# Patient Record
Sex: Female | Born: 1975 | Race: White | Smoking: Former smoker
Health system: Southern US, Community
[De-identification: ages and names within clinical notes are randomized; demographics above are authoritative.]

## PROBLEM LIST (undated history)

## (undated) DIAGNOSIS — M5126 Other intervertebral disc displacement, lumbar region: Secondary | ICD-10-CM

## (undated) DIAGNOSIS — E039 Hypothyroidism, unspecified: Secondary | ICD-10-CM

## (undated) DIAGNOSIS — D849 Immunodeficiency, unspecified: Secondary | ICD-10-CM

## (undated) DIAGNOSIS — C801 Malignant (primary) neoplasm, unspecified: Secondary | ICD-10-CM

## (undated) DIAGNOSIS — J45909 Unspecified asthma, uncomplicated: Secondary | ICD-10-CM

## (undated) DIAGNOSIS — N939 Abnormal uterine and vaginal bleeding, unspecified: Secondary | ICD-10-CM

## (undated) DIAGNOSIS — M51369 Other intervertebral disc degeneration, lumbar region without mention of lumbar back pain or lower extremity pain: Secondary | ICD-10-CM

## (undated) DIAGNOSIS — M5136 Other intervertebral disc degeneration, lumbar region: Secondary | ICD-10-CM

## (undated) HISTORY — PX: OTHER SURGICAL HISTORY: SHX169

---
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
## 1999-06-26 ENCOUNTER — Other Ambulatory Visit: Admission: RE | Admit: 1999-06-26 | Discharge: 1999-06-26 | Payer: Self-pay | Admitting: Obstetrics and Gynecology

## 2000-01-23 ENCOUNTER — Inpatient Hospital Stay (HOSPITAL_COMMUNITY): Admission: AD | Admit: 2000-01-23 | Discharge: 2000-01-26 | Payer: Self-pay | Admitting: *Deleted

## 2000-01-29 ENCOUNTER — Encounter: Admission: RE | Admit: 2000-01-29 | Discharge: 2000-04-28 | Payer: Self-pay | Admitting: *Deleted

## 2004-08-24 ENCOUNTER — Other Ambulatory Visit: Admission: RE | Admit: 2004-08-24 | Discharge: 2004-08-24 | Payer: Self-pay | Admitting: Obstetrics and Gynecology

## 2005-02-09 ENCOUNTER — Encounter: Admission: RE | Admit: 2005-02-09 | Discharge: 2005-02-09 | Payer: Self-pay | Admitting: Endocrinology

## 2006-02-24 ENCOUNTER — Other Ambulatory Visit: Admission: RE | Admit: 2006-02-24 | Discharge: 2006-02-24 | Payer: Self-pay | Admitting: Obstetrics and Gynecology

## 2009-06-05 ENCOUNTER — Observation Stay (HOSPITAL_COMMUNITY): Admission: EM | Admit: 2009-06-05 | Discharge: 2009-06-08 | Payer: Self-pay | Admitting: Emergency Medicine

## 2010-09-10 LAB — CBC
HCT: 31.7 % — ABNORMAL LOW (ref 36.0–46.0)
HCT: 32.5 % — ABNORMAL LOW (ref 36.0–46.0)
HCT: 32.8 % — ABNORMAL LOW (ref 36.0–46.0)
Hemoglobin: 10.8 g/dL — ABNORMAL LOW (ref 12.0–15.0)
Hemoglobin: 10.9 g/dL — ABNORMAL LOW (ref 12.0–15.0)
Hemoglobin: 10.9 g/dL — ABNORMAL LOW (ref 12.0–15.0)
MCHC: 33.1 g/dL (ref 30.0–36.0)
MCHC: 33.6 g/dL (ref 30.0–36.0)
MCHC: 34 g/dL (ref 30.0–36.0)
MCV: 91.1 fL (ref 78.0–100.0)
MCV: 92.8 fL (ref 78.0–100.0)
MCV: 94 fL (ref 78.0–100.0)
Platelets: 153 10*3/uL (ref 150–400)
Platelets: 159 10*3/uL (ref 150–400)
Platelets: 171 10*3/uL (ref 150–400)
RBC: 3.48 MIL/uL — ABNORMAL LOW (ref 3.87–5.11)
RBC: 3.49 MIL/uL — ABNORMAL LOW (ref 3.87–5.11)
RBC: 3.51 MIL/uL — ABNORMAL LOW (ref 3.87–5.11)
RDW: 12.9 % (ref 11.5–15.5)
RDW: 12.9 % (ref 11.5–15.5)
RDW: 13.3 % (ref 11.5–15.5)
WBC: 3.9 10*3/uL — ABNORMAL LOW (ref 4.0–10.5)
WBC: 4.2 10*3/uL (ref 4.0–10.5)
WBC: 5.8 10*3/uL (ref 4.0–10.5)

## 2010-09-10 LAB — COMPREHENSIVE METABOLIC PANEL
ALT: 13 U/L (ref 0–35)
AST: 18 U/L (ref 0–37)
Albumin: 3.2 g/dL — ABNORMAL LOW (ref 3.5–5.2)
Alkaline Phosphatase: 26 U/L — ABNORMAL LOW (ref 39–117)
BUN: 6 mg/dL (ref 6–23)
CO2: 26 mEq/L (ref 19–32)
Calcium: 8.3 mg/dL — ABNORMAL LOW (ref 8.4–10.5)
Chloride: 106 mEq/L (ref 96–112)
Creatinine, Ser: 0.77 mg/dL (ref 0.4–1.2)
GFR calc Af Amer: >60 mL/min (ref >60)
GFR calc non Af Amer: >60 mL/min (ref >60)
Glucose, Bld: 85 mg/dL (ref 70–99)
Potassium: 4 mEq/L (ref 3.5–5.1)
Sodium: 136 mEq/L (ref 135–145)
Total Bilirubin: 0.7 mg/dL (ref 0.3–1.2)
Total Protein: 5.4 g/dL — ABNORMAL LOW (ref 6.0–8.3)

## 2010-09-10 LAB — BASIC METABOLIC PANEL
BUN: 10 mg/dL (ref 6–23)
BUN: 8 mg/dL (ref 6–23)
CO2: 25 mEq/L (ref 19–32)
CO2: 25 mEq/L (ref 19–32)
Calcium: 8.3 mg/dL — ABNORMAL LOW (ref 8.4–10.5)
Calcium: 8.5 mg/dL (ref 8.4–10.5)
Chloride: 107 mEq/L (ref 96–112)
Chloride: 111 mEq/L (ref 96–112)
Creatinine, Ser: 0.77 mg/dL (ref 0.4–1.2)
Creatinine, Ser: 0.85 mg/dL (ref 0.4–1.2)
GFR calc Af Amer: >60 mL/min (ref >60)
GFR calc Af Amer: >60 mL/min (ref >60)
GFR calc non Af Amer: >60 mL/min (ref >60)
GFR calc non Af Amer: >60 mL/min (ref >60)
Glucose, Bld: 85 mg/dL (ref 70–99)
Glucose, Bld: 94 mg/dL (ref 70–99)
Potassium: 3.7 mEq/L (ref 3.5–5.1)
Potassium: 4 mEq/L (ref 3.5–5.1)
Sodium: 138 mEq/L (ref 135–145)
Sodium: 140 mEq/L (ref 135–145)

## 2010-09-10 LAB — URINALYSIS, ROUTINE W REFLEX MICROSCOPIC
Bilirubin Urine: NEGATIVE
Glucose, UA: NEGATIVE mg/dL
Ketones, ur: NEGATIVE mg/dL
Leukocytes, UA: NEGATIVE
Nitrite: NEGATIVE
Protein, ur: NEGATIVE mg/dL
Specific Gravity, Urine: 1.009 (ref 1.005–1.030)
Urobilinogen, UA: 0.2 mg/dL (ref 0.0–1.0)
pH: 6 (ref 5.0–8.0)

## 2010-09-10 LAB — URINE MICROSCOPIC-ADD ON

## 2010-09-10 LAB — CALCIUM: Calcium: 8.9 mg/dL (ref 8.4–10.5)

## 2011-09-09 ENCOUNTER — Other Ambulatory Visit: Payer: Self-pay | Admitting: Emergency Medicine

## 2012-02-07 ENCOUNTER — Other Ambulatory Visit: Payer: Self-pay | Admitting: Family Medicine

## 2012-02-07 ENCOUNTER — Other Ambulatory Visit (HOSPITAL_COMMUNITY)
Admission: RE | Admit: 2012-02-07 | Discharge: 2012-02-07 | Disposition: A | Discharge status: Home / Self Care | Payer: 59 | Source: Ambulatory Visit | Attending: Family Medicine | Admitting: Family Medicine

## 2012-02-07 DIAGNOSIS — Z1151 Encounter for screening for human papillomavirus (HPV): Secondary | ICD-10-CM | POA: Insufficient documentation

## 2012-02-07 DIAGNOSIS — Z124 Encounter for screening for malignant neoplasm of cervix: Secondary | ICD-10-CM | POA: Insufficient documentation

## 2012-10-08 ENCOUNTER — Other Ambulatory Visit: Payer: Self-pay | Admitting: Physician Assistant

## 2013-02-10 ENCOUNTER — Other Ambulatory Visit: Payer: Self-pay | Admitting: Family Medicine

## 2013-02-10 DIAGNOSIS — R140 Abdominal distension (gaseous): Secondary | ICD-10-CM

## 2013-02-15 ENCOUNTER — Ambulatory Visit
Admission: RE | Admit: 2013-02-15 | Discharge: 2013-02-15 | Disposition: A | Discharge status: Home / Self Care | Payer: 59 | Source: Ambulatory Visit | Attending: Family Medicine | Admitting: Family Medicine

## 2013-02-15 DIAGNOSIS — R140 Abdominal distension (gaseous): Secondary | ICD-10-CM

## 2013-04-05 ENCOUNTER — Emergency Department (HOSPITAL_COMMUNITY)
Admission: EM | Admit: 2013-04-05 | Discharge: 2013-04-05 | Disposition: A | Discharge status: Home / Self Care | Payer: 59 | Attending: Emergency Medicine | Admitting: Emergency Medicine

## 2013-04-05 ENCOUNTER — Encounter (HOSPITAL_COMMUNITY): Payer: Self-pay | Admitting: Emergency Medicine

## 2013-04-05 DIAGNOSIS — D649 Anemia, unspecified: Secondary | ICD-10-CM

## 2013-04-05 DIAGNOSIS — E229 Hyperfunction of pituitary gland, unspecified: Secondary | ICD-10-CM | POA: Insufficient documentation

## 2013-04-05 DIAGNOSIS — M2550 Pain in unspecified joint: Secondary | ICD-10-CM | POA: Insufficient documentation

## 2013-04-05 DIAGNOSIS — Z87891 Personal history of nicotine dependence: Secondary | ICD-10-CM | POA: Insufficient documentation

## 2013-04-05 DIAGNOSIS — Z79899 Other long term (current) drug therapy: Secondary | ICD-10-CM | POA: Insufficient documentation

## 2013-04-05 HISTORY — DX: Immunodeficiency, unspecified: D84.9

## 2013-04-05 LAB — CBC WITH DIFFERENTIAL/PLATELET
Basophils Absolute: 0 10*3/uL (ref 0.0–0.1)
Basophils Relative: 1 % (ref 0–1)
Eosinophils Absolute: 0.3 10*3/uL (ref 0.0–0.7)
Eosinophils Relative: 6 % — ABNORMAL HIGH (ref 0–5)
HCT: 33.8 % — ABNORMAL LOW (ref 36.0–46.0)
Hemoglobin: 11.4 g/dL — ABNORMAL LOW (ref 12.0–15.0)
Lymphocytes Relative: 23 % (ref 12–46)
Lymphs Abs: 1 10*3/uL (ref 0.7–4.0)
MCH: 30 pg (ref 26.0–34.0)
MCHC: 33.7 g/dL (ref 30.0–36.0)
MCV: 88.9 fL (ref 78.0–100.0)
Monocytes Absolute: 0.6 10*3/uL (ref 0.1–1.0)
Monocytes Relative: 13 % — ABNORMAL HIGH (ref 3–12)
Neutro Abs: 2.5 10*3/uL (ref 1.7–7.7)
Neutrophils Relative %: 57 % (ref 43–77)
Platelets: 182 10*3/uL (ref 150–400)
RBC: 3.8 MIL/uL — ABNORMAL LOW (ref 3.87–5.11)
RDW: 12.6 % (ref 11.5–15.5)
WBC: 4.3 10*3/uL (ref 4.0–10.5)

## 2013-04-05 LAB — BASIC METABOLIC PANEL
BUN: 8 mg/dL (ref 6–23)
CO2: 26 mEq/L (ref 19–32)
Calcium: 9 mg/dL (ref 8.4–10.5)
Chloride: 104 mEq/L (ref 96–112)
Creatinine, Ser: 0.72 mg/dL (ref 0.50–1.10)
GFR calc Af Amer: >90 mL/min (ref >90)
GFR calc non Af Amer: >90 mL/min (ref >90)
Glucose, Bld: 91 mg/dL (ref 70–99)
Potassium: 4 mEq/L (ref 3.5–5.1)
Sodium: 139 mEq/L (ref 135–145)

## 2013-04-05 MED ORDER — PREDNISONE 50 MG PO TABS: 50.0000 mg | ORAL_TABLET | Freq: Every day | ORAL | Status: DC

## 2013-04-05 MED ORDER — PREDNISONE 20 MG PO TABS
60.0000 mg | ORAL_TABLET | Freq: Once | ORAL | Status: AC
  Administered 2013-04-05: 60 mg via ORAL
  Filled 2013-04-05: qty 3

## 2013-04-05 NOTE — ED Provider Notes (Signed)
CSN: [id number]     Arrival date & time 04/05/13  1455 History   First MD Initiated Contact with Patient 04/05/13 1911     Chief Complaint  Patient presents with  . Joint Pain   (Consider location/radiation/quality/duration/timing/severity/associated sxs/prior Treatment) The history is provided by the patient.   37 year old female noted onset 6 days ago of some tight feeling in her left arm which gradually resolved. 2 days ago, and she broke out in a rash and saw a physician in urgent care who recommended Benadryl. Yesterday, she noticed aching in multiple joints including her hands, wrists, ankles, and knees. She denies fever, chills, sweats. She denies chest pain or dyspnea. She denies nausea or vomiting. She denies any travel outside this area. She does not recall any tick bites. Today, she took a dose of ibuprofen and her hands are feeling considerably better. She has noted that her hands are swollen and she did take off her rings. Also, she recently saw an endocrinologist who diagnosed her with Hashimoto's disease. The only medication she is Bromocryptine which she has been taking for over 20 years. She is taking that for hyperprolactinemia.  Past Medical History  Diagnosis Date  . Immune deficiency disorder     hoshimoto's ( autoimmune)   History reviewed. No pertinent past surgical history. No family history on file. History  Substance Use Topics  . Smoking status: Former Games developer  . Smokeless tobacco: Not on file  . Alcohol Use: No   OB History   Grav Para Term Preterm Abortions TAB SAB Ect Mult Living                 Review of Systems  All other systems reviewed and are negative.    Allergies  Codeine and Sulfa antibiotics  Home Medications   Current Outpatient Rx  Name  Route  Sig  Dispense  Refill  . albuterol (PROVENTIL HFA;VENTOLIN HFA) 108 (90 BASE) MCG/ACT inhaler   Inhalation   Inhale 2 puffs into the lungs every 6 (six) hours as needed for wheezing.          . bromocriptine (PARLODEL) 2.5 MG tablet   Oral   Take 2.5 mg by mouth daily.         . diphenhydrAMINE (BENADRYL) 25 mg capsule   Oral   Take 25 mg by mouth daily as needed for itching or allergies.         Marland Kitchen ibuprofen (ADVIL,MOTRIN) 200 MG tablet   Oral   Take 400 mg by mouth daily as needed for pain.          BP 104/62  Pulse 70  Temp(Src) 98.1 F (36.7 C) (Oral)  Resp 20  Wt 128 lb (58.06 kg)  SpO2 99% Physical Exam  Nursing note and vitals reviewed.  37 year old female, resting comfortably and in no acute distress. Vital signs are normal. Oxygen saturation is 99%, which is normal. Head is normocephalic and atraumatic. PERRLA, EOMI. Oropharynx is clear. Neck is nontender and supple without adenopathy or JVD. Back is nontender and there is no CVA tenderness. Lungs are clear without rales, wheezes, or rhonchi. Chest is nontender. Heart has regular rate and rhythm without murmur. Abdomen is soft, flat, nontender without masses or hepatosplenomegaly and peristalsis is normoactive. Extremities have no cyanosis or edema. There is mild soft tissue swelling of the fingers of both hands. No joint effusions are seen and there is no palpable synovial thickening. Full passive range of motion is present  in all joints. Skin is warm and dry without rash. Neurologic: Mental status is normal, cranial nerves are intact, there are no motor or sensory deficits.  ED Course  Procedures (including critical care time) Labs Review Results for orders placed during the hospital encounter of 04/05/13  BASIC METABOLIC PANEL      Result Value Range   Sodium 139  135 - 145 mEq/L   Potassium 4.0  3.5 - 5.1 mEq/L   Chloride 104  96 - 112 mEq/L   CO2 26  19 - 32 mEq/L   Glucose, Bld 91  70 - 99 mg/dL   BUN 8  6 - 23 mg/dL   Creatinine, Ser 7.82  0.50 - 1.10 mg/dL   Calcium 9.0  8.4 - 95.6 mg/dL   GFR calc non Af Amer >90  >90 mL/min   GFR calc Af Amer >90  >90 mL/min  CBC WITH  DIFFERENTIAL      Result Value Range   WBC 4.3  4.0 - 10.5 K/uL   RBC 3.80 (*) 3.87 - 5.11 MIL/uL   Hemoglobin 11.4 (*) 12.0 - 15.0 g/dL   HCT 21.3 (*) 08.6 - 57.8 %   MCV 88.9  78.0 - 100.0 fL   MCH 30.0  26.0 - 34.0 pg   MCHC 33.7  30.0 - 36.0 g/dL   RDW 46.9  62.9 - 52.8 %   Platelets 182  150 - 400 K/uL   Neutrophils Relative % 57  43 - 77 %   Neutro Abs 2.5  1.7 - 7.7 K/uL   Lymphocytes Relative 23  12 - 46 %   Lymphs Abs 1.0  0.7 - 4.0 K/uL   Monocytes Relative 13 (*) 3 - 12 %   Monocytes Absolute 0.6  0.1 - 1.0 K/uL   Eosinophils Relative 6 (*) 0 - 5 %   Eosinophils Absolute 0.3  0.0 - 0.7 K/uL   Basophils Relative 1  0 - 1 %   Basophils Absolute 0.0  0.0 - 0.1 K/uL   MDM   1. Polyarthralgia   2. Anemia    Polyarthralgia of uncertain cause. Note is made of mild elevation of eosinophils and monocytes which could indicate a viral infection. Consider possible early autoimmune disorder such as rheumatoid arthritis or lupus. Mild anemia is noted but hemoglobin is actually improved compared with baseline hemoglobin from 2010. She'll be treated with a short course of prednisone and then follow up with PCP. She may need workup for autoimmune arthropathies if symptoms recur following discontinuation of prednisone.    Dione Booze, MD 04/05/13 [phone]

## 2013-04-05 NOTE — ED Notes (Signed)
Dr. Glick at bedside.  

## 2013-04-05 NOTE — ED Notes (Signed)
Pt states last Tuesday with left arm pain and saw MD.  Pt states on Saturday had redness to arms, legs, and saw urgent care on Saturday and placed on benadryl.  Pt states since Sunday all joints are aching.  No fever.  No chest pain or sob

## 2014-02-08 ENCOUNTER — Ambulatory Visit (INDEPENDENT_AMBULATORY_CARE_PROVIDER_SITE_OTHER): Payer: 59 | Admitting: Neurology

## 2014-02-08 ENCOUNTER — Encounter: Payer: Self-pay | Admitting: Neurology

## 2014-02-08 VITALS — BP 101/66 | HR 74 | Ht 63.0 in | Wt 136.0 lb

## 2014-02-08 DIAGNOSIS — R209 Unspecified disturbances of skin sensation: Secondary | ICD-10-CM

## 2014-02-08 DIAGNOSIS — R200 Anesthesia of skin: Principal | ICD-10-CM

## 2014-02-08 NOTE — Progress Notes (Signed)
PATIENT: Betty Harrington DOB: Oct 26, 1975  HISTORICAL  Betty Harrington is a 38 years old right-handed female, referred by her primary care physician Dr. Orland Penman for evaluation of intermittent left hand paresthesia  She had past medical history of Hashimoto's thyroid disease, hypothyroidism, on thyroid supplement, also had a previous history of prolactinemia, previously repeat MRI of the brain with and without contrast September 2006 was normal, there was no pituitary pathology. 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, presented with bilateral forearm area discoloration and discomfort, rash,  Over the past few months, she noticed intermittent left arm numbness, usually starting at left fifth fingers, sometimes involving fourth, and left arm, but she denies significant neck pain, no shooting pain from neck to left upper extremity, no weakness, no gait difficulty, there was also one episode she noticed discoloration of her left fifth finger, lasting for one day  She denies visual loss, no left facial, left leg involvement  She does desk job, type  on the computer, tends to rest her elbow on the desk  REVIEW OF SYSTEMS: Full 14 system review of systems performed and notable only for weight gain, fatigue, chest pain, palpitation, cough, headache, numbness, wheezing  ALLERGIES: Allergies  Allergen Reactions  . Codeine   . Sulfa Antibiotics Itching    HOME MEDICATIONS: Current Outpatient Prescriptions on File Prior to Visit  Medication Sig Dispense Refill  . albuterol (PROVENTIL HFA;VENTOLIN HFA) 108 (90 BASE) MCG/ACT inhaler Inhale 2 puffs into the lungs every 6 (six) hours as needed for wheezing.      . bromocriptine (PARLODEL) 2.5 MG tablet Take 2.5 mg by mouth daily.         PAST MEDICAL HISTORY: Past Medical History  Diagnosis Date  . Immune deficiency disorder     hoshimoto's ( autoimmune)    PAST SURGICAL  HISTORY: No past surgical history on file.  FAMILY HISTORY: No family history on file.  SOCIAL HISTORY:  History   Social History  . Marital Status: Single    Spouse Name: N/A    Number of Children: 79  . Years of Education: N/A   Occupational History  . Not on file.   Social History Main Topics  . Smoking status: Former Research scientist (life sciences)  . Smokeless tobacco: Not on file  . Alcohol Use: No  . Drug Use: No  . Sexual Activity: Not on file   Other Topics Concern  . Not on file   Social History Narrative  . No narrative on file   PHYSICAL EXAM   Filed Vitals:   02/08/14 1341  BP: 101/66  Pulse: 74  Height: 5\' 3"  (1.6 m)  Weight: 136 lb (61.689 kg)    Not recorded    Body mass index is 24.1 kg/(m^2).   Generalized: In no acute distress  Neck: Supple, no carotid bruits   Cardiac: Regular rate rhythm  Pulmonary: Clear to auscultation bilaterally  Musculoskeletal: No deformity  Neurological examination  Mentation: Alert oriented to time, place, history taking, and causual conversation  Cranial nerve II-XII: Pupils were equal round reactive to light. Extraocular movements were full.  Visual field were full on confrontational test. Bilateral fundi were sharp.  Facial sensation and strength were normal. Hearing was intact to finger rubbing bilaterally. Uvula tongue midline.  Head turning and shoulder shrug and were normal and symmetric.Tongue protrusion into cheek strength was normal.  Motor: Normal tone, bulk and strength.  Sensory: Intact to fine touch, pinprick,  preserved vibratory sensation, and proprioception at toes.  Coordination: Normal finger to nose, heel-to-shin bilaterally there was no truncal ataxia  Gait: Rising up from seated position without assistance, normal stance, without trunk ataxia, moderate stride, good arm swing, smooth turning, able to perform tiptoe, and heel walking without difficulty.   Romberg signs: Negative  Deep tendon reflexes:  Brachioradialis 2/2, biceps 2/2, triceps 2/2, patellar 2/2, Achilles 2/2, plantar responses were flexor bilaterally.   DIAGNOSTIC DATA (LABS, IMAGING, TESTING) - I reviewed patient records, labs, notes, testing and imaging myself where available.  Lab Results  Component Value Date   WBC 4.3 04/05/2013   HGB 11.4* 04/05/2013   HCT 33.8* 04/05/2013   MCV 88.9 04/05/2013   PLT 182 04/05/2013      Component Value Date/Time   NA 139 04/05/2013 1554   K 4.0 04/05/2013 1554   CL 104 04/05/2013 1554   CO2 26 04/05/2013 1554   GLUCOSE 91 04/05/2013 1554   BUN 8 04/05/2013 1554   CREATININE 0.72 04/05/2013 1554   CALCIUM 9.0 04/05/2013 1554   PROT 5.4* 06/07/2009 0442   ALBUMIN 3.2* 06/07/2009 0442   AST 18 06/07/2009 0442   ALT 13 06/07/2009 0442   ALKPHOS 26* 06/07/2009 0442   BILITOT 0.7 06/07/2009 0442   GFRNONAA >90 04/05/2013 1554   GFRAA >90 04/05/2013 1554   ASSESSMENT AND PLAN  Betty Harrington is a 38 y.o. female  was intermittent left hand, numbness, most suggestive of left ulnar compression, essentially normal neurological examination, no left Tinel signs.  After discussed patient, will continue observe her symptoms, callback office for worsening symptoms, hold off further evaluation at this point, return to clinic in 3 months     Marcial Pacas, M.D. Ph.D.  [facility] [address], [place], [place] [address] [phone]

## 2014-03-22 ENCOUNTER — Other Ambulatory Visit: Payer: Self-pay | Admitting: Family Medicine

## 2014-03-22 ENCOUNTER — Other Ambulatory Visit (HOSPITAL_COMMUNITY)
Admission: RE | Admit: 2014-03-22 | Discharge: 2014-03-22 | Disposition: A | Discharge status: Home / Self Care | Payer: 59 | Source: Ambulatory Visit | Attending: Family Medicine | Admitting: Family Medicine

## 2014-03-22 DIAGNOSIS — Z124 Encounter for screening for malignant neoplasm of cervix: Principal | ICD-10-CM

## 2014-03-24 LAB — CYTOLOGY - PAP

## 2014-06-10 HISTORY — PX: OTHER SURGICAL HISTORY: SHX169

## 2014-10-21 ENCOUNTER — Ambulatory Visit
Admission: RE | Admit: 2014-10-21 | Discharge: 2014-10-21 | Disposition: A | Discharge status: Home / Self Care | Payer: 59 | Source: Ambulatory Visit | Attending: Allergy and Immunology | Admitting: Allergy and Immunology

## 2014-10-21 ENCOUNTER — Other Ambulatory Visit: Payer: Self-pay | Admitting: Allergy and Immunology

## 2014-10-21 DIAGNOSIS — J4530 Mild persistent asthma, uncomplicated: Principal | ICD-10-CM

## 2015-04-06 ENCOUNTER — Other Ambulatory Visit: Payer: Self-pay | Admitting: Family Medicine

## 2015-04-06 DIAGNOSIS — M5440 Lumbago with sciatica, unspecified side: Principal | ICD-10-CM

## 2015-04-06 DIAGNOSIS — M545 Low back pain, unspecified: Secondary | ICD-10-CM

## 2015-04-16 ENCOUNTER — Ambulatory Visit
Admission: RE | Admit: 2015-04-16 | Discharge: 2015-04-16 | Disposition: A | Discharge status: Home / Self Care | Payer: 59 | Source: Ambulatory Visit | Attending: Family Medicine | Admitting: Family Medicine

## 2015-04-16 DIAGNOSIS — M5440 Lumbago with sciatica, unspecified side: Principal | ICD-10-CM

## 2015-04-16 DIAGNOSIS — M545 Low back pain, unspecified: Secondary | ICD-10-CM

## 2015-05-11 HISTORY — PX: BACK SURGERY: SHX140

## 2015-05-13 ENCOUNTER — Encounter (HOSPITAL_COMMUNITY): Payer: Self-pay | Admitting: Emergency Medicine

## 2015-05-13 ENCOUNTER — Emergency Department (HOSPITAL_COMMUNITY)
Admission: EM | Admit: 2015-05-13 | Discharge: 2015-05-13 | Disposition: A | Discharge status: Home / Self Care | Payer: 59 | Attending: Emergency Medicine | Admitting: Emergency Medicine

## 2015-05-13 DIAGNOSIS — M5442 Lumbago with sciatica, left side: Principal | ICD-10-CM

## 2015-05-13 DIAGNOSIS — Z862 Personal history of diseases of the blood and blood-forming organs and certain disorders involving the immune mechanism: Secondary | ICD-10-CM

## 2015-05-13 DIAGNOSIS — Z91040 Latex allergy status: Secondary | ICD-10-CM

## 2015-05-13 DIAGNOSIS — Z85828 Personal history of other malignant neoplasm of skin: Secondary | ICD-10-CM

## 2015-05-13 DIAGNOSIS — Z87891 Personal history of nicotine dependence: Secondary | ICD-10-CM

## 2015-05-13 DIAGNOSIS — G8929 Other chronic pain: Secondary | ICD-10-CM

## 2015-05-13 DIAGNOSIS — M5432 Sciatica, left side: Secondary | ICD-10-CM

## 2015-05-13 DIAGNOSIS — Z79899 Other long term (current) drug therapy: Secondary | ICD-10-CM

## 2015-05-13 DIAGNOSIS — M545 Low back pain: Secondary | ICD-10-CM | POA: Diagnosis present

## 2015-05-13 HISTORY — DX: Other intervertebral disc displacement, lumbar region: M51.26

## 2015-05-13 HISTORY — DX: Malignant (primary) neoplasm, unspecified: C80.1

## 2015-05-13 HISTORY — DX: Other intervertebral disc degeneration, lumbar region without mention of lumbar back pain or lower extremity pain: M51.369

## 2015-05-13 HISTORY — DX: Other intervertebral disc degeneration, lumbar region: M51.36

## 2015-05-13 MED ORDER — HYDROMORPHONE HCL 1 MG/ML IJ SOLN
1.0000 mg | Freq: Once | INTRAMUSCULAR | Status: DC
  Filled 2015-05-13: qty 1

## 2015-05-13 MED ORDER — HYDROMORPHONE HCL 2 MG/ML IJ SOLN: 2.0000 mg | Freq: Once | INTRAMUSCULAR | Status: DC

## 2015-05-13 MED ORDER — HYDROMORPHONE HCL 2 MG/ML IJ SOLN
2.0000 mg | Freq: Once | INTRAMUSCULAR | Status: AC
  Administered 2015-05-13: 2 mg via INTRAMUSCULAR
  Filled 2015-05-13: qty 1

## 2015-05-13 MED ORDER — OXYCODONE-ACETAMINOPHEN 7.5-325 MG PO TABS: 1.0000 | ORAL_TABLET | ORAL | Status: DC | PRN

## 2015-05-13 MED ORDER — HYDROMORPHONE HCL 1 MG/ML IJ SOLN: 1.0000 mg | Freq: Once | INTRAMUSCULAR | Status: DC

## 2015-05-13 NOTE — ED Notes (Signed)
Pt. Is unable to ambulate at this time due to pain in leg.

## 2015-05-13 NOTE — ED Provider Notes (Signed)
CSN: [id number]     Arrival date & time 05/13/15  1708 History   First MD Initiated Contact with Patient 05/13/15 1744     Chief Complaint  Patient presents with  . Back Pain   HPI  Betty Harrington is a 39 year old female with a past medical history of back pain presenting with back pain. She states that she has been seen by her PCP and neurosurgery for this complaint over the past 2 months. She had an MRI performed which showed multiple bulging disks in her lumbar spine. She states that the pain originates in her lumbar back and shoots down the left posterior thigh into the heel. She also reports numbness with the pain. She states the last evening the pain became so severe that she could not ambulate. She had previously been taking Motrin and Vicodin but reports this is no longer controlling her pain. She has been evaluated by one neurosurgeon but did not like him so she has a follow-up appointment with another neurosurgeon this week. Denies fevers, chills, loss of bowel or bladder, loss of sensation in the lower extremities or weakness of the lower extremities. She states the pain today is typical of her chronic pain but just more intense in severity. No change in symptoms of pain.  Past Medical History  Diagnosis Date  . Immune deficiency disorder (HCC)     hoshimoto's ( autoimmune)  . Bulging lumbar disc   . Cancer [facility])     skin   Past Surgical History  Procedure Laterality Date  . None     Family History  Problem Relation Age of Onset  . Cancer Mother   . Hyperlipidemia Mother   . Hypertension Mother   . Heart failure Father   . Cancer Father   . Hypertension Father   . Diabetes Father   . COPD Father   . Diabetes Sister    Social History  Substance Use Topics  . Smoking status: Former Smoker    Types: Cigarettes  . Smokeless tobacco: Never Used     Comment: Quit in 2010  . Alcohol Use: No   OB History    No data available     Review of Systems  Constitutional:  Negative for fever and chills.  Genitourinary: Negative for difficulty urinating.  Musculoskeletal: Positive for back pain and gait problem.  Skin: Negative for rash.  Neurological: Positive for numbness. Negative for weakness.  All other systems reviewed and are negative.     Allergies  Sulfa antibiotics; Tussionex pennkinetic er; Codeine; and Latex  Home Medications   Prior to Admission medications   Medication Sig Start Date End Date Taking? Authorizing Provider  albuterol (PROVENTIL HFA;VENTOLIN HFA) 108 (90 BASE) MCG/ACT inhaler Inhale 2 puffs into the lungs every 6 (six) hours as needed for wheezing.   Yes Historical Provider, MD  bromocriptine (PARLODEL) 2.5 MG tablet Take 2.5 mg by mouth daily.   Yes Historical Provider, MD  diazepam (VALIUM) 10 MG tablet Take 10 mg by mouth every 6 (six) hours as needed for anxiety.   Yes Historical Provider, MD  HYDROcodone-acetaminophen (NORCO/VICODIN) 5-325 MG tablet Take 1 tablet by mouth every 6 (six) hours as needed for moderate pain.   Yes Historical Provider, MD  ibuprofen (ADVIL,MOTRIN) 200 MG tablet Take 800 mg by mouth every 6 (six) hours as needed.   Yes Historical Provider, MD  levothyroxine (SYNTHROID, LEVOTHROID) 50 MCG tablet Take 50 mcg by mouth daily.  02/04/14  Yes Historical Provider, MD  sucralfate (CARAFATE) 1 GM/10ML suspension Take 1 g by mouth 2 (two) times daily.   Yes Historical Provider, MD  Vitamin D, Ergocalciferol, (DRISDOL) 50000 UNITS CAPS capsule Take 50,000 Units by mouth daily.  01/17/14  Yes Historical Provider, MD  oxyCODONE-acetaminophen (PERCOCET) 7.5-325 MG tablet Take 1 tablet by mouth every 4 (four) hours as needed for severe pain. 05/13/15   Stevi Barrett, PA-C   BP 140/81 mmHg  Pulse 93  Temp(Src) 98.4 F (36.9 C) (Temporal)  Resp 18  SpO2 100%  LMP 04/10/2015 Physical Exam  Constitutional: She appears well-developed and well-nourished. No distress.  HENT:  Head: Normocephalic and atraumatic.   Eyes: Conjunctivae are normal. Right eye exhibits no discharge. Left eye exhibits no discharge. No scleral icterus.  Neck: Normal range of motion.  Cardiovascular: Normal rate and regular rhythm.   Pulmonary/Chest: Effort normal. No respiratory distress.  Musculoskeletal: Normal range of motion.  Patient is able to straight leg raise both legs without exacerbation of the pain. She moves both lower extremities spontaneously while on the stretcher. No tenderness to palpation over the lumbar region. Full range of motion of the lumbar spine intact  Neurological: She is alert. Coordination normal.  5 out of 5 strength in the bilateral lower extremities though with significant pain on testing of the left leg. Sensation to light touch intact throughout.  Skin: Skin is warm and dry. No rash noted. She is not diaphoretic.  No rash noted over the lumbar region or over the lower extremities  Psychiatric: She has a normal mood and affect. Her behavior is normal.  Nursing note and vitals reviewed.   ED Course  Procedures (including critical care time) Labs Review Labs Reviewed - No data to display  Imaging Review No results found. I have personally reviewed and evaluated these images and lab results as part of my medical decision-making.   EKG Interpretation None      MDM   Final diagnoses:  Sciatica of left side   Patient presenting with back pain x 2 months. She is being followed by neurosurgery and has a follow-up appointment this week. She reports acute worsening of her sciatic pain on the left. Afebrile. Non-focal neuro exam. Pain controlled with Dilaudid in emergency department. No loss of bowel or bladder control. No weakness in the lower extremities. No concern for cauda equina. No history of IVDU or cancer. Conservative therapy including back exercises, heat, ice, tylenol or ibuprofen discussed. Will give short course of pain medications until she can follow up with her neurosurgeon.  Return precautions discussed and given in discharge paperwork. Pt is stable for discharge.      Josephina Gip, PA-C 05/14/15 0106

## 2015-05-13 NOTE — ED Provider Notes (Signed)
Medical screening examination/treatment/procedure(s) were conducted as a shared visit with non-physician practitioner(s) and myself.  I personally evaluated the patient during the encounter.   EKG Interpretation None     Patient here with worsening chronic back pain without signs of spinal cord compression. Will give pain medication here as well as prescription for sore course medication and referral back to her neurosurgeon  Lacretia Leigh, MD 05/13/15 2015

## 2015-05-13 NOTE — Discharge Instructions (Signed)
Sciatica With Rehab The sciatic nerve runs from the back down the leg and is responsible for sensation and control of the muscles in the back (posterior) side of the thigh, lower leg, and foot. Sciatica is a condition that is characterized by inflammation of this nerve.  SYMPTOMS   Signs of nerve damage, including numbness and/or weakness along the posterior side of the lower extremity.  Pain in the back of the thigh that may also travel down the leg.  Pain that worsens when sitting for long periods of time.  Occasionally, pain in the back or buttock. CAUSES  Inflammation of the sciatic nerve is the cause of sciatica. The inflammation is due to something irritating the nerve. Common sources of irritation include:  Sitting for long periods of time.  Direct trauma to the nerve.  Arthritis of the spine.  Herniated or ruptured disk.  Slipping of the vertebrae (spondylolisthesis).  Pressure from soft tissues, such as muscles or ligament-like tissue (fascia). RISK INCREASES WITH:  Sports that place pressure or stress on the spine (football or weightlifting).  Poor strength and flexibility.  Failure to warm up properly before activity.  Family history of low back pain or disk disorders.  Previous back injury or surgery.  Poor body mechanics, especially when lifting, or poor posture. PREVENTION   Warm up and stretch properly before activity.  Maintain physical fitness:  Strength, flexibility, and endurance.  Cardiovascular fitness.  Learn and use proper technique, especially with posture and lifting. When possible, have coach correct improper technique.  Avoid activities that place stress on the spine. PROGNOSIS If treated properly, then sciatica usually resolves within 6 weeks. However, occasionally surgery is necessary.  RELATED COMPLICATIONS   Permanent nerve damage, including pain, numbness, tingle, or weakness.  Chronic back pain.  Risks of surgery: infection,  bleeding, nerve damage, or damage to surrounding tissues. TREATMENT Treatment initially involves resting from any activities that aggravate your symptoms. The use of ice and medication may help reduce pain and inflammation. The use of strengthening and stretching exercises may help reduce pain with activity. These exercises may be performed at home or with referral to a therapist. A therapist may recommend further treatments, such as transcutaneous electronic nerve stimulation (TENS) or ultrasound. Your caregiver may recommend corticosteroid injections to help reduce inflammation of the sciatic nerve. If symptoms persist despite non-surgical (conservative) treatment, then surgery may be recommended. MEDICATION  If pain medication is necessary, then nonsteroidal anti-inflammatory medications, such as aspirin and ibuprofen, or other minor pain relievers, such as acetaminophen, are often recommended.  Do not take pain medication for 7 days before surgery.  Prescription pain relievers may be given if deemed necessary by your caregiver. Use only as directed and only as much as you need.  Ointments applied to the skin may be helpful.  Corticosteroid injections may be given by your caregiver. These injections should be reserved for the most serious cases, because they may only be given a certain number of times. HEAT AND COLD  Cold treatment (icing) relieves pain and reduces inflammation. Cold treatment should be applied for 10 to 15 minutes every 2 to 3 hours for inflammation and pain and immediately after any activity that aggravates your symptoms. Use ice packs or massage the area with a piece of ice (ice massage).  Heat treatment may be used prior to performing the stretching and strengthening activities prescribed by your caregiver, physical therapist, or athletic trainer. Use a heat pack or soak the injury in warm water.   SEEK MEDICAL CARE IF:  Treatment seems to offer no benefit, or the condition  worsens.  Any medications produce adverse side effects. EXERCISES  RANGE OF MOTION (ROM) AND STRETCHING EXERCISES - Sciatica Most people with sciatic will find that their symptoms worsen with either excessive bending forward (flexion) or arching at the low back (extension). The exercises which will help resolve your symptoms will focus on the opposite motion. Your physician, physical therapist or athletic trainer will help you determine which exercises will be most helpful to resolve your low back pain. Do not complete any exercises without first consulting with your clinician. Discontinue any exercises which worsen your symptoms until you speak to your clinician. If you have pain, numbness or tingling which travels down into your buttocks, leg or foot, the goal of the therapy is for these symptoms to move closer to your back and eventually resolve. Occasionally, these leg symptoms will get better, but your low back pain may worsen; this is typically an indication of progress in your rehabilitation. Be certain to be very alert to any changes in your symptoms and the activities in which you participated in the 24 hours prior to the change. Sharing this information with your clinician will allow him/her to most efficiently treat your condition. These exercises may help you when beginning to rehabilitate your injury. Your symptoms may resolve with or without further involvement from your physician, physical therapist or athletic trainer. While completing these exercises, remember:   Restoring tissue flexibility helps normal motion to return to the joints. This allows healthier, less painful movement and activity.  An effective stretch should be held for at least 30 seconds.  A stretch should never be painful. You should only feel a gentle lengthening or release in the stretched tissue. FLEXION RANGE OF MOTION AND STRETCHING EXERCISES: STRETCH - Flexion, Single Knee to Chest   Lie on a firm bed or floor  with both legs extended in front of you.  Keeping one leg in contact with the floor, bring your opposite knee to your chest. Hold your leg in place by either grabbing behind your thigh or at your knee.  Pull until you feel a gentle stretch in your low back. Hold __________ seconds.  Slowly release your grasp and repeat the exercise with the opposite side. Repeat __________ times. Complete this exercise __________ times per day.  STRETCH - Flexion, Double Knee to Chest  Lie on a firm bed or floor with both legs extended in front of you.  Keeping one leg in contact with the floor, bring your opposite knee to your chest.  Tense your stomach muscles to support your back and then lift your other knee to your chest. Hold your legs in place by either grabbing behind your thighs or at your knees.  Pull both knees toward your chest until you feel a gentle stretch in your low back. Hold __________ seconds.  Tense your stomach muscles and slowly return one leg at a time to the floor. Repeat __________ times. Complete this exercise __________ times per day.  STRETCH - Low Trunk Rotation   Lie on a firm bed or floor. Keeping your legs in front of you, bend your knees so they are both pointed toward the ceiling and your feet are flat on the floor.  Extend your arms out to the side. This will stabilize your upper body by keeping your shoulders in contact with the floor.  Gently and slowly drop both knees together to one side until   you feel a gentle stretch in your low back. Hold for __________ seconds.  Tense your stomach muscles to support your low back as you bring your knees back to the starting position. Repeat the exercise to the other side. Repeat __________ times. Complete this exercise __________ times per day  EXTENSION RANGE OF MOTION AND FLEXIBILITY EXERCISES: STRETCH - Extension, Prone on Elbows  Lie on your stomach on the floor, a bed will be too soft. Place your palms about shoulder  width apart and at the height of your head.  Place your elbows under your shoulders. If this is too painful, stack pillows under your chest.  Allow your body to relax so that your hips drop lower and make contact more completely with the floor.  Hold this position for __________ seconds.  Slowly return to lying flat on the floor. Repeat __________ times. Complete this exercise __________ times per day.  RANGE OF MOTION - Extension, Prone Press Ups  Lie on your stomach on the floor, a bed will be too soft. Place your palms about shoulder width apart and at the height of your head.  Keeping your back as relaxed as possible, slowly straighten your elbows while keeping your hips on the floor. You may adjust the placement of your hands to maximize your comfort. As you gain motion, your hands will come more underneath your shoulders.  Hold this position __________ seconds.  Slowly return to lying flat on the floor. Repeat __________ times. Complete this exercise __________ times per day.  STRENGTHENING EXERCISES - Sciatica  These exercises may help you when beginning to rehabilitate your injury. These exercises should be done near your "sweet spot." This is the neutral, low-back arch, somewhere between fully rounded and fully arched, that is your least painful position. When performed in this safe range of motion, these exercises can be used for people who have either a flexion or extension based injury. These exercises may resolve your symptoms with or without further involvement from your physician, physical therapist or athletic trainer. While completing these exercises, remember:   Muscles can gain both the endurance and the strength needed for everyday activities through controlled exercises.  Complete these exercises as instructed by your physician, physical therapist or athletic trainer. Progress with the resistance and repetition exercises only as your caregiver advises.  You may  experience muscle soreness or fatigue, but the pain or discomfort you are trying to eliminate should never worsen during these exercises. If this pain does worsen, stop and make certain you are following the directions exactly. If the pain is still present after adjustments, discontinue the exercise until you can discuss the trouble with your clinician. STRENGTHENING - Deep Abdominals, Pelvic Tilt   Lie on a firm bed or floor. Keeping your legs in front of you, bend your knees so they are both pointed toward the ceiling and your feet are flat on the floor.  Tense your lower abdominal muscles to press your low back into the floor. This motion will rotate your pelvis so that your tail bone is scooping upwards rather than pointing at your feet or into the floor.  With a gentle tension and even breathing, hold this position for __________ seconds. Repeat __________ times. Complete this exercise __________ times per day.  STRENGTHENING - Abdominals, Crunches   Lie on a firm bed or floor. Keeping your legs in front of you, bend your knees so they are both pointed toward the ceiling and your feet are flat on the   floor. Cross your arms over your chest.  Slightly tip your chin down without bending your neck.  Tense your abdominals and slowly lift your trunk high enough to just clear your shoulder blades. Lifting higher can put excessive stress on the low back and does not further strengthen your abdominal muscles.  Control your return to the starting position. Repeat __________ times. Complete this exercise __________ times per day.  STRENGTHENING - Quadruped, Opposite UE/LE Lift  Assume a hands and knees position on a firm surface. Keep your hands under your shoulders and your knees under your hips. You may place padding under your knees for comfort.  Find your neutral spine and gently tense your abdominal muscles so that you can maintain this position. Your shoulders and hips should form a rectangle  that is parallel with the floor and is not twisted.  Keeping your trunk steady, lift your right hand no higher than your shoulder and then your left leg no higher than your hip. Make sure you are not holding your breath. Hold this position __________ seconds.  Continuing to keep your abdominal muscles tense and your back steady, slowly return to your starting position. Repeat with the opposite arm and leg. Repeat __________ times. Complete this exercise __________ times per day.  STRENGTHENING - Abdominals and Quadriceps, Straight Leg Raise   Lie on a firm bed or floor with both legs extended in front of you.  Keeping one leg in contact with the floor, bend the other knee so that your foot can rest flat on the floor.  Find your neutral spine, and tense your abdominal muscles to maintain your spinal position throughout the exercise.  Slowly lift your straight leg off the floor about 6 inches for a count of 15, making sure to not hold your breath.  Still keeping your neutral spine, slowly lower your leg all the way to the floor. Repeat this exercise with each leg __________ times. Complete this exercise __________ times per day. POSTURE AND BODY MECHANICS CONSIDERATIONS - Sciatica Keeping correct posture when sitting, standing or completing your activities will reduce the stress put on different body tissues, allowing injured tissues a chance to heal and limiting painful experiences. The following are general guidelines for improved posture. Your physician or physical therapist will provide you with any instructions specific to your needs. While reading these guidelines, remember:  The exercises prescribed by your provider will help you have the flexibility and strength to maintain correct postures.  The correct posture provides the optimal environment for your joints to work. All of your joints have less wear and tear when properly supported by a spine with good posture. This means you will  experience a healthier, less painful body.  Correct posture must be practiced with all of your activities, especially prolonged sitting and standing. Correct posture is as important when doing repetitive low-stress activities (typing) as it is when doing a single heavy-load activity (lifting). RESTING POSITIONS Consider which positions are most painful for you when choosing a resting position. If you have pain with flexion-based activities (sitting, bending, stooping, squatting), choose a position that allows you to rest in a less flexed posture. You would want to avoid curling into a fetal position on your side. If your pain worsens with extension-based activities (prolonged standing, working overhead), avoid resting in an extended position such as sleeping on your stomach. Most people will find more comfort when they rest with their spine in a more neutral position, neither too rounded nor too   arched. Lying on a non-sagging bed on your side with a pillow between your knees, or on your back with a pillow under your knees will often provide some relief. Keep in mind, being in any one position for a prolonged period of time, no matter how correct your posture, can still lead to stiffness. PROPER SITTING POSTURE In order to minimize stress and discomfort on your spine, you must sit with correct posture Sitting with good posture should be effortless for a healthy body. Returning to good posture is a gradual process. Many people can work toward this most comfortably by using various supports until they have the flexibility and strength to maintain this posture on their own. When sitting with proper posture, your ears will fall over your shoulders and your shoulders will fall over your hips. You should use the back of the chair to support your upper back. Your low back will be in a neutral position, just slightly arched. You may place a small pillow or folded towel at the base of your low back for support.  When  working at a desk, create an environment that supports good, upright posture. Without extra support, muscles fatigue and lead to excessive strain on joints and other tissues. Keep these recommendations in mind: CHAIR:   A chair should be able to slide under your desk when your back makes contact with the back of the chair. This allows you to work closely.  The chair's height should allow your eyes to be level with the upper part of your monitor and your hands to be slightly lower than your elbows. BODY POSITION  Your feet should make contact with the floor. If this is not possible, use a foot rest.  Keep your ears over your shoulders. This will reduce stress on your neck and low back. INCORRECT SITTING POSTURES   If you are feeling tired and unable to assume a healthy sitting posture, do not slouch or slump. This puts excessive strain on your back tissues, causing more damage and pain. Healthier options include:  Using more support, like a lumbar pillow.  Switching tasks to something that requires you to be upright or walking.  Talking a brief walk.  Lying down to rest in a neutral-spine position. PROLONGED STANDING WHILE SLIGHTLY LEANING FORWARD  When completing a task that requires you to lean forward while standing in one place for a long time, place either foot up on a stationary 2-4 inch high object to help maintain the best posture. When both feet are on the ground, the low back tends to lose its slight inward curve. If this curve flattens (or becomes too large), then the back and your other joints will experience too much stress, fatigue more quickly and can cause pain.  CORRECT STANDING POSTURES Proper standing posture should be assumed with all daily activities, even if they only take a few moments, like when brushing your teeth. As in sitting, your ears should fall over your shoulders and your shoulders should fall over your hips. You should keep a slight tension in your abdominal  muscles to brace your spine. Your tailbone should point down to the ground, not behind your body, resulting in an over-extended swayback posture.  INCORRECT STANDING POSTURES  Common incorrect standing postures include a forward head, locked knees and/or an excessive swayback. WALKING Walk with an upright posture. Your ears, shoulders and hips should all line-up. PROLONGED ACTIVITY IN A FLEXED POSITION When completing a task that requires you to bend forward   at your waist or lean over a low surface, try to find a way to stabilize 3 of 4 of your limbs. You can place a hand or elbow on your thigh or rest a knee on the surface you are reaching across. This will provide you more stability so that your muscles do not fatigue as quickly. By keeping your knees relaxed, or slightly bent, you will also reduce stress across your low back. CORRECT LIFTING TECHNIQUES DO :   Assume a wide stance. This will provide you more stability and the opportunity to get as close as possible to the object which you are lifting.  Tense your abdominals to brace your spine; then bend at the knees and hips. Keeping your back locked in a neutral-spine position, lift using your leg muscles. Lift with your legs, keeping your back straight.  Test the weight of unknown objects before attempting to lift them.  Try to keep your elbows locked down at your sides in order get the best strength from your shoulders when carrying an object.  Always ask for help when lifting heavy or awkward objects. INCORRECT LIFTING TECHNIQUES DO NOT:   Lock your knees when lifting, even if it is a small object.  Bend and twist. Pivot at your feet or move your feet when needing to change directions.  Assume that you cannot safely pick up a paperclip without proper posture.   This information is not intended to replace advice given to you by your health care provider. Make sure you discuss any questions you have with your health care provider.     Document Released: 05/27/2005 Document Revised: 10/11/2014 Document Reviewed: 09/08/2008 [employer] Interactive Patient Education 2016 [employer]  

## 2015-05-13 NOTE — ED Notes (Addendum)
Pt arrived via EMS with c/o back pain with burning/numbness radiating down posterior lt leg. Pt reported having difficulty walking. Pt reported Motrin 3200mg  daily x3 weeks and Hydrocodone/APAP 5/325mg  [id number]. Pt reported having bulging disc. Pt reported to PMD and was given instructions to stop taking the Motrin and given prescription for Carafate. Pt reported that she was advised by neurologist to come to ED for evaluation. Pt denies urinary problems.

## 2015-05-13 NOTE — ED Notes (Signed)
Patient complained of pain in leg

## 2016-03-22 DIAGNOSIS — J040 Acute laryngitis: Secondary | ICD-10-CM | POA: Diagnosis not present

## 2016-03-22 DIAGNOSIS — R05 Cough: Secondary | ICD-10-CM | POA: Diagnosis not present

## 2016-04-16 DIAGNOSIS — R0789 Other chest pain: Secondary | ICD-10-CM | POA: Diagnosis not present

## 2016-04-16 DIAGNOSIS — M940 Chondrocostal junction syndrome [Tietze]: Secondary | ICD-10-CM | POA: Diagnosis not present

## 2016-04-16 DIAGNOSIS — F419 Anxiety disorder, unspecified: Secondary | ICD-10-CM | POA: Diagnosis not present

## 2016-04-16 DIAGNOSIS — R197 Diarrhea, unspecified: Secondary | ICD-10-CM | POA: Diagnosis not present

## 2016-05-24 DIAGNOSIS — Z803 Family history of malignant neoplasm of breast: Secondary | ICD-10-CM | POA: Diagnosis not present

## 2016-05-24 DIAGNOSIS — Z1231 Encounter for screening mammogram for malignant neoplasm of breast: Secondary | ICD-10-CM | POA: Diagnosis not present

## 2016-06-20 ENCOUNTER — Other Ambulatory Visit (HOSPITAL_COMMUNITY)
Admission: RE | Admit: 2016-06-20 | Discharge: 2016-06-20 | Disposition: A | Discharge status: Home / Self Care | Payer: BLUE CROSS/BLUE SHIELD | Source: Ambulatory Visit | Attending: Family Medicine | Admitting: Family Medicine

## 2016-06-20 ENCOUNTER — Other Ambulatory Visit: Payer: Self-pay | Admitting: Family Medicine

## 2016-06-20 DIAGNOSIS — Z124 Encounter for screening for malignant neoplasm of cervix: Principal | ICD-10-CM

## 2016-06-20 DIAGNOSIS — Z0000 Encounter for general adult medical examination without abnormal findings: Secondary | ICD-10-CM | POA: Diagnosis not present

## 2016-06-20 DIAGNOSIS — E559 Vitamin D deficiency, unspecified: Secondary | ICD-10-CM | POA: Diagnosis not present

## 2016-06-24 DIAGNOSIS — R194 Change in bowel habit: Secondary | ICD-10-CM | POA: Diagnosis not present

## 2016-06-27 LAB — CYTOLOGY - PAP: Diagnosis: NEGATIVE

## 2016-08-12 DIAGNOSIS — J111 Influenza due to unidentified influenza virus with other respiratory manifestations: Secondary | ICD-10-CM | POA: Diagnosis not present

## 2016-08-12 DIAGNOSIS — R110 Nausea: Secondary | ICD-10-CM | POA: Diagnosis not present

## 2016-08-12 DIAGNOSIS — J45901 Unspecified asthma with (acute) exacerbation: Secondary | ICD-10-CM | POA: Diagnosis not present

## 2016-09-02 DIAGNOSIS — E039 Hypothyroidism, unspecified: Secondary | ICD-10-CM | POA: Diagnosis not present

## 2016-09-02 DIAGNOSIS — E221 Hyperprolactinemia: Secondary | ICD-10-CM | POA: Diagnosis not present

## 2016-09-09 DIAGNOSIS — E063 Autoimmune thyroiditis: Secondary | ICD-10-CM | POA: Diagnosis not present

## 2016-09-09 DIAGNOSIS — E039 Hypothyroidism, unspecified: Secondary | ICD-10-CM | POA: Diagnosis not present

## 2016-09-09 DIAGNOSIS — E221 Hyperprolactinemia: Secondary | ICD-10-CM | POA: Diagnosis not present

## 2016-09-12 ENCOUNTER — Other Ambulatory Visit: Payer: Self-pay | Admitting: Endocrinology

## 2016-09-12 DIAGNOSIS — E221 Hyperprolactinemia: Principal | ICD-10-CM

## 2016-09-26 ENCOUNTER — Ambulatory Visit
Admission: RE | Admit: 2016-09-26 | Discharge: 2016-09-26 | Disposition: A | Discharge status: Home / Self Care | Payer: BLUE CROSS/BLUE SHIELD | Source: Ambulatory Visit | Attending: Endocrinology | Admitting: Endocrinology

## 2016-09-26 DIAGNOSIS — E221 Hyperprolactinemia: Principal | ICD-10-CM

## 2016-09-26 MED ORDER — GADOBENATE DIMEGLUMINE 529 MG/ML IV SOLN
7.0000 mL | Freq: Once | INTRAVENOUS | Status: AC | PRN
  Administered 2016-09-26: 7 mL via INTRAVENOUS

## 2016-10-23 DIAGNOSIS — E221 Hyperprolactinemia: Secondary | ICD-10-CM | POA: Diagnosis not present

## 2016-12-03 DIAGNOSIS — R102 Pelvic and perineal pain: Secondary | ICD-10-CM | POA: Diagnosis not present

## 2016-12-04 ENCOUNTER — Other Ambulatory Visit: Payer: Self-pay | Admitting: Family Medicine

## 2016-12-04 DIAGNOSIS — R102 Pelvic and perineal pain: Principal | ICD-10-CM

## 2016-12-12 ENCOUNTER — Other Ambulatory Visit: Payer: BLUE CROSS/BLUE SHIELD

## 2016-12-19 ENCOUNTER — Other Ambulatory Visit: Payer: BLUE CROSS/BLUE SHIELD

## 2017-01-02 ENCOUNTER — Ambulatory Visit
Admission: RE | Admit: 2017-01-02 | Discharge: 2017-01-02 | Disposition: A | Discharge status: Home / Self Care | Payer: BLUE CROSS/BLUE SHIELD | Source: Ambulatory Visit | Attending: Family Medicine | Admitting: Family Medicine

## 2017-01-02 DIAGNOSIS — R102 Pelvic and perineal pain: Principal | ICD-10-CM

## 2017-01-06 ENCOUNTER — Ambulatory Visit (INDEPENDENT_AMBULATORY_CARE_PROVIDER_SITE_OTHER): Payer: BLUE CROSS/BLUE SHIELD | Admitting: Physician Assistant

## 2017-01-06 ENCOUNTER — Encounter: Payer: Self-pay | Admitting: Physician Assistant

## 2017-01-06 VITALS — BP 110/72 | HR 76 | Temp 98.5°F | Resp 16 | Ht 63.0 in | Wt 139.6 lb

## 2017-01-06 DIAGNOSIS — R102 Pelvic and perineal pain: Principal | ICD-10-CM

## 2017-01-06 NOTE — Patient Instructions (Signed)
     IF you received an x-ray today, you will receive an invoice from [facility]. Please contact [facility] at [phone] with questions or concerns regarding your invoice.   IF you received labwork today, you will receive an invoice from [facility]. Please contact [facility] at [phone] with questions or concerns regarding your invoice.   Our billing staff will not be able to assist you with questions regarding bills from these companies.  You will be contacted with the lab results as soon as they are available. The fastest way to get your results is to activate your My Chart account. Instructions are located on the last page of this paperwork. If you have not heard from us regarding the results in 2 weeks, please contact this office.     

## 2017-01-06 NOTE — Progress Notes (Signed)
Betty Harrington  MRN: [id number] DOB: 03-Aug-1975  PCP: Leighton Ruff, MD  Chief Complaint  Patient presents with  . Results    go over ultrasound results from [facility]     Subjective:  Pt presents to clinic for concerns that she night have pinworms.  She was diagnosed years ago and was treated successfully.  Several months ago she developed some itching in her anus that she thought was from shaving and it resolved but she is concerned because she read something on google about abdominal pain and possible pinworm infection.  She has been having cyclical left pelvic pain with ovulation for months and she has been seeing her PCP for it and she got back [place] results today she showed she has hydrosalpinx and she is worried about what might be causing it.  She has never has abd surgery. She has an appt with GYN next week.   History is obtained by patient.  Review of Systems  Constitutional: Negative for chills and fever.  Gastrointestinal: Negative for abdominal pain, blood in stool, constipation and rectal pain.  Genitourinary: Positive for pelvic pain (left sided).    Patient Active Problem List   Diagnosis Date Noted  . Numbness 02/08/2014    Current Outpatient Prescriptions on File Prior to Visit  Medication Sig Dispense Refill  . albuterol (PROVENTIL HFA;VENTOLIN HFA) 108 (90 BASE) MCG/ACT inhaler Inhale 2 puffs into the lungs every 6 (six) hours as needed for wheezing.    . bromocriptine (PARLODEL) 2.5 MG tablet Take 2.5 mg by mouth daily.    Marland Kitchen levothyroxine (SYNTHROID, LEVOTHROID) 50 MCG tablet Take 50 mcg by mouth daily.     . diazepam (VALIUM) 10 MG tablet Take 10 mg by mouth every 6 (six) hours as needed for anxiety.    Marland Kitchen HYDROcodone-acetaminophen (NORCO/VICODIN) 5-325 MG tablet Take 1 tablet by mouth every 6 (six) hours as needed for moderate pain.    Marland Kitchen ibuprofen (ADVIL,MOTRIN) 200 MG tablet Take 800 mg by mouth every 6 (six) hours as needed.    Marland Kitchen oxyCODONE-acetaminophen  (PERCOCET) 7.5-325 MG tablet Take 1 tablet by mouth every 4 (four) hours as needed for severe pain. (Patient not taking: Reported on 01/06/2017) 20 tablet 0  . sucralfate (CARAFATE) 1 GM/10ML suspension Take 1 g by mouth 2 (two) times daily.    . Vitamin D, Ergocalciferol, (DRISDOL) 50000 UNITS CAPS capsule Take 50,000 Units by mouth daily.      No current facility-administered medications on file prior to visit.     Allergies  Allergen Reactions  . Oxycodone-Acetaminophen Palpitations  . Sulfa Antibiotics Itching  . Tussionex Pennkinetic Er [Hydrocod Polst-Cpm Polst Er]     Makes her produce more saliva.   . Codeine Rash  . Dilaudid [Hydromorphone Hcl] Palpitations  . Latex Rash    Past Medical History:  Diagnosis Date  . Bulging lumbar disc   . Cancer ([place])    skin  . 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 Patient works full time at [employer].    Education High school.   Right handed.   Caffeine one cup daily.    Social History  Substance Use Topics  . Smoking status: Former Smoker    Types: Cigarettes  . Smokeless tobacco: Never Used     Comment: Quit in 2010  . Alcohol use No   family history  includes COPD in her father; Cancer in her father and mother; Diabetes in her father and sister; Heart failure in her father; Hyperlipidemia in her mother; Hypertension in her father and mother.     Objective:  BP 110/72   Pulse 76   Temp 98.5 F (36.9 C) (Oral)   Resp 16   Ht 5\' 3"  (1.6 m)   Wt 139 lb 9.6 oz (63.3 kg)   LMP 12/25/2016   SpO2 100%   BMI 24.73 kg/m  Body mass index is 24.73 kg/m.  Physical Exam  Constitutional: She is oriented to person, place, and time and well-developed, well-nourished, and in no distress.  HENT:  Head: Normocephalic and atraumatic.  Right Ear: Hearing and external ear normal.  Left Ear: Hearing and external  ear normal.  Eyes: Conjunctivae are normal.  Neck: Normal range of motion.  Cardiovascular: Normal rate, regular rhythm and normal heart sounds.   No murmur heard. Pulmonary/Chest: Effort normal and breath sounds normal. She has no wheezes.  Genitourinary: Rectal exam shows external hemorrhoid. Rectal exam shows no fissure.  Genitourinary Comments: No pinworms visible  Neurological: She is alert and oriented to person, place, and time. Gait normal.  Skin: Skin is warm and dry.  Psychiatric: Mood, memory, affect and judgment normal.  Vitals reviewed.   Assessment and Plan :  Pelvic pain - no pin worms seen on exam - discussed with patient that these can not always be seen but this is low possibility of cause of her pelvic pain.  She will f/u with GYN for further testing.  She was offered but declined treatment for pinworms to help rest her mind about the infection but since such low liklihood she would to not be treated today.  Windell Hummingbird PA-C  [facility] 01/06/2017 6:45 PM

## 2017-01-20 DIAGNOSIS — R102 Pelvic and perineal pain: Secondary | ICD-10-CM | POA: Diagnosis not present

## 2017-01-20 DIAGNOSIS — N7011 Chronic salpingitis: Secondary | ICD-10-CM | POA: Diagnosis not present

## 2017-03-07 DIAGNOSIS — N921 Excessive and frequent menstruation with irregular cycle: Secondary | ICD-10-CM | POA: Diagnosis not present

## 2017-03-07 DIAGNOSIS — R102 Pelvic and perineal pain: Secondary | ICD-10-CM | POA: Diagnosis not present

## 2017-03-07 DIAGNOSIS — R1032 Left lower quadrant pain: Secondary | ICD-10-CM | POA: Diagnosis not present

## 2017-03-25 NOTE — H&P (Signed)
Betty Harrington  DICTATION # [id number] CSN# [id number]   Margarette Asal, MD 03/25/2017 11:12 AM

## 2017-03-25 NOTE — H&P (Signed)
Betty Harrington  DICTATION # [id number] CSN# [id number]   Margarette Asal, MD 03/25/2017 11:11 AM

## 2017-03-26 NOTE — H&P (Signed)
Betty Harrington, Betty Harrington           ACCOUNT NO.:  [id number]  MEDICAL RECORD NO.:  [id number]  LOCATION:                                 FACILITY:  PHYSICIAN:  Ralene Bathe. Matthew Saras, M.D.    DATE OF BIRTH:  DATE OF ADMISSION: DATE OF DISCHARGE:                             HISTORY & PHYSICAL   CHIEF COMPLAINT:  Pelvic pain and irregular bleeding.  HISTORY OF PRESENT ILLNESS:  A 41 year old, G2, P62, who has a 42 year old, not currently sexually active.  She was originally referred from an outside ultrasound that showed a normal uterus, 2 cm thick endometrium with a right ovarian simple cyst 4.1 cm and a probable left hydrosalpinx.  We did a followup ultrasound in our office dated March 07, 2017, that demonstrated a possible density within the endometrium, right ovary.  This cyst had resolved.  No free fluid.  Did not really appreciate a left hydrosalpinx at that time.  When these findings were explained to her and after reviewing her symptoms with the level of pain she was having, she prefers to proceed with diagnostic laparoscopy with possible LSO, hysteroscopy, D and C, and MyoSure of a small polyp or fibroid is countered.  This procedure including specific risks related to bleeding, infection, wound infection, phlebitis, with possible need to complete the surgery by open technique all reviewed with her, which she understands and accepts.  ALLERGIES: 1. DILAUDID. 2. SULFA. 3. PERCOCET.  CURRENT MEDICATIONS: 1. Bromocriptine 2.5 daily. 2. Levothyroxine. 3. Ventolin inhaler p.r.n. 4. And vitamin D.  FAMILY HISTORY:  Significant for headache, heart disease, asthma, peptic ulcer, IBS, thyroid disease, diverticulosis, arthritis, diabetes, hypertension, and breast, uterine, and colon cancer in family members.  She has had 1 vaginal delivery in 2002 and L5-S1 microdiskectomy in 2016 and one prior D and E  SOCIAL HISTORY:  Denies alcohol, tobacco, or drug use.  Dr.  Leighton Ruff is her family physician.  PHYSICAL EXAMINATION:  VITAL SIGNS:  Temp 98.2, blood pressure 120/78. HEENT:  Unremarkable. NECK:  Supple without masses. LUNGS:  Clear. CARDIOVASCULAR:  Regular rate and rhythm without murmurs, rubs, gallops noted. BREASTS:  Without masses. ABDOMEN:  Soft, flat, nontender. PELVIC:  Vulva, vagina, cervix normal.  Uterus mid to posterior, normal sized.  No unusual nodularity.  Minimal tenderness on the left.  No definite mass.  Right side unremarkable. EXTREMITIES:  Unremarkable. NEUROLOGIC:  Unremarkable.  IMPRESSION:  Chronic pelvic pain, irregular bleeding that may be secondary to endometrial polyp.  PLAN:  D and C, hysteroscopy with MyoSure, laparoscopy with possible LSO.  Procedure and risks discussed as above.     Richard M. Matthew Saras, M.D.   ______________________________ Ralene Bathe. Matthew Saras, M.D.    [facility]/MEDQ  D:  03/25/2017  T:  03/25/2017  Job:  [id number]

## 2017-03-31 DIAGNOSIS — R102 Pelvic and perineal pain: Secondary | ICD-10-CM | POA: Diagnosis not present

## 2017-03-31 NOTE — H&P (Signed)
Lajuana Dovel  DICTATION # [id number] CSN# [id number]   Margarette Asal, MD 03/31/2017 1:01 PM

## 2017-04-01 ENCOUNTER — Encounter (HOSPITAL_BASED_OUTPATIENT_CLINIC_OR_DEPARTMENT_OTHER): Payer: Self-pay | Admitting: *Deleted

## 2017-04-01 NOTE — Progress Notes (Addendum)
Npo after midnight arrive 530 am 04-10-17 [facility] take levothyroxine, loratadine with sip of water in am, mother driver, needs urine pregnancy ,  and hemaglobin

## 2017-04-01 NOTE — Consult Note (Signed)
NAMEHUDSYN, Betty Harrington           ACCOUNT NO.:  [id number]  MEDICAL RECORD NO.:  [id number]  LOCATION:                                 FACILITY:  PHYSICIAN:  Ralene Bathe. Matthew Saras, M.D.    DATE OF BIRTH:  DATE OF CONSULTATION: DATE OF DISCHARGE:                                CONSULTATION   CHIEF COMPLAINT:  Pelvic pain, history of familial endometriosis, possible left hydrosalpinx, irregular bleeding.  HISTORY OF PRESENT ILLNESS:  A 41 year old, G2, P72, who has a 42 year old, not currently sexually active.  She was originally referred followed with an outside ultrasound that showed a normal uterus, 2 cm thick endometrium with a simple right ovarian cyst 4.1 cm, probable hydrosalpinx on the left.  When I saw her, she was still complaining of midcycle pain in particular on the left.  Followup ultrasound done in our office showed that the right simple cyst had resolved, looked to be either a polyp or submucous fibroid; however, noted within the cavity. No free fluid.  No evidence of hydrosalpinx.  Because of continued problems with pain and irregular bleeding, presents now for D and C, hysteroscopy, with possible MyoSure and laparoscopy with possible LSO. This procedure including specific risks related to bleeding, infection, transfusion, adjacent organ injury, wound infection, phlebitis, possible need to complete the surgery by open technique, all discussed with her which she understands and accepts.  She did opt for HCS screening (hereditary cancer syndrome screening) in our office, which is pending.  ALLERGIES: 1. SULFA. 2. LATEX. 3. DILAUDID. 4. PERCOCET.  CURRENT MEDICATIONS:  Bromocriptine, levothyroxine, Ventolin inhaler p.r.n.  PAST SURGICAL HISTORY: 1. Vaginal delivery in 2001. 2. Had a microdiskectomy in December, 2016.  FAMILY HISTORY:  Significant for headache, heart disease, respiratory disease, ulcer, thyroid disease, UTI, diverticulosis, arthritis, diabetes,  hypertension, and history of breast, uterine, and colon cancer.  SOCIAL HISTORY:  Denies alcohol, tobacco, or drug use.  She is divorced.  PHYSICAL EXAMINATION:  VITAL SIGNS:  Temp 98.2, blood pressure 104/70. HEENT:  Unremarkable. NECK:  Supple without masses. LUNGS:  Clear. CARDIOVASCULAR:  Regular rate and rhythm without murmurs, rubs, gallops. BREASTS:  Without masses. ABDOMEN:  Soft, flat, nontender. PELVIC:  Vulva, vagina, cervix normal.  Uterus mid positioned, normal sized.  Adnexa without masses.  Slightly tender on the left, but no nodularity.  Right side negative. EXTREMITIES:  Unremarkable. NEUROLOGIC:  Unremarkable.  IMPRESSION:  Abnormal bleeding, pelvic pain.  Recent ultrasound showed the possibility of possible left hydrosalpinx, either a polyp or small submucous fibroid as noted above.  PLAN:  Laparoscopy with possible LSO, hysteroscopy, D and C with MyoSure.  Procedure and risks reviewed as above.     Richard M. Matthew Saras, M.D.   ______________________________ Ralene Bathe. Matthew Saras, M.D.    [facility]/MEDQ  D:  03/31/2017  T:  03/31/2017  Job:  [id number]

## 2017-04-09 NOTE — Anesthesia Preprocedure Evaluation (Signed)
Anesthesia Evaluation  Patient identified by MRN, date of birth, ID band Patient awake    Reviewed: Allergy & Precautions, NPO status , Patient's Chart, lab work & pertinent test results  Airway Mallampati: II  TM Distance: >3 FB Neck ROM: Full    Dental no notable dental hx.    Pulmonary asthma , former smoker,    Pulmonary exam normal breath sounds clear to auscultation       Cardiovascular negative cardio ROS Normal cardiovascular exam Rhythm:Regular Rate:Normal     Neuro/Psych negative neurological ROS  negative psych ROS   GI/Hepatic negative GI ROS, Neg liver ROS,   Endo/Other  Hypothyroidism   Renal/GU negative Renal ROS  negative genitourinary   Musculoskeletal negative musculoskeletal ROS (+)   Abdominal   Peds negative pediatric ROS (+)  Hematology negative hematology ROS (+)   Anesthesia Other Findings   Reproductive/Obstetrics negative OB ROS                             Anesthesia Physical Anesthesia Plan  ASA: II  Anesthesia Plan: General   Post-op Pain Management:    Induction: Intravenous  PONV Risk Score and Plan: 3 and Ondansetron, Dexamethasone and Midazolam  Airway Management Planned: Oral ETT  Additional Equipment:   Intra-op Plan:   Post-operative Plan: Extubation in OR  Informed Consent: I have reviewed the patients History and Physical, chart, labs and discussed the procedure including the risks, benefits and alternatives for the proposed anesthesia with the patient or authorized representative who has indicated his/her understanding and acceptance.   Dental advisory given  Plan Discussed with: CRNA and Surgeon  Anesthesia Plan Comments:         Anesthesia Quick Evaluation

## 2017-04-10 ENCOUNTER — Ambulatory Visit (HOSPITAL_BASED_OUTPATIENT_CLINIC_OR_DEPARTMENT_OTHER)
Admission: RE | Admit: 2017-04-10 | Discharge: 2017-04-10 | Disposition: A | Discharge status: Home / Self Care | Payer: BLUE CROSS/BLUE SHIELD | Source: Ambulatory Visit | Attending: Obstetrics and Gynecology | Admitting: Obstetrics and Gynecology

## 2017-04-10 ENCOUNTER — Ambulatory Visit (HOSPITAL_BASED_OUTPATIENT_CLINIC_OR_DEPARTMENT_OTHER): Payer: BLUE CROSS/BLUE SHIELD | Admitting: Anesthesiology

## 2017-04-10 ENCOUNTER — Encounter (HOSPITAL_BASED_OUTPATIENT_CLINIC_OR_DEPARTMENT_OTHER): Payer: Self-pay | Admitting: *Deleted

## 2017-04-10 ENCOUNTER — Encounter (HOSPITAL_BASED_OUTPATIENT_CLINIC_OR_DEPARTMENT_OTHER)
Admission: RE | Disposition: A | Discharge status: Home / Self Care | Payer: Self-pay | Source: Ambulatory Visit | Attending: Obstetrics and Gynecology

## 2017-04-10 DIAGNOSIS — Z79899 Other long term (current) drug therapy: Secondary | ICD-10-CM

## 2017-04-10 DIAGNOSIS — N83291 Other ovarian cyst, right side: Secondary | ICD-10-CM

## 2017-04-10 DIAGNOSIS — Z8249 Family history of ischemic heart disease and other diseases of the circulatory system: Secondary | ICD-10-CM

## 2017-04-10 DIAGNOSIS — R102 Pelvic and perineal pain: Secondary | ICD-10-CM

## 2017-04-10 DIAGNOSIS — G8929 Other chronic pain: Principal | ICD-10-CM

## 2017-04-10 DIAGNOSIS — Z882 Allergy status to sulfonamides status: Secondary | ICD-10-CM

## 2017-04-10 DIAGNOSIS — N939 Abnormal uterine and vaginal bleeding, unspecified: Secondary | ICD-10-CM

## 2017-04-10 DIAGNOSIS — Z885 Allergy status to narcotic agent status: Secondary | ICD-10-CM

## 2017-04-10 DIAGNOSIS — N840 Polyp of corpus uteri: Secondary | ICD-10-CM

## 2017-04-10 DIAGNOSIS — N803 Endometriosis of pelvic peritoneum: Secondary | ICD-10-CM

## 2017-04-10 DIAGNOSIS — N809 Endometriosis, unspecified: Secondary | ICD-10-CM | POA: Diagnosis not present

## 2017-04-10 DIAGNOSIS — N800 Endometriosis of uterus: Secondary | ICD-10-CM | POA: Diagnosis not present

## 2017-04-10 HISTORY — PX: DILATATION & CURETTAGE/HYSTEROSCOPY WITH MYOSURE: SHX6511

## 2017-04-10 HISTORY — DX: Unspecified asthma, uncomplicated: J45.909

## 2017-04-10 HISTORY — PX: LAPAROSCOPY: SHX197

## 2017-04-10 HISTORY — DX: Abnormal uterine and vaginal bleeding, unspecified: N93.9

## 2017-04-10 HISTORY — DX: Hypothyroidism, unspecified: E03.9

## 2017-04-10 LAB — POCT PREGNANCY, URINE: Preg Test, Ur: NEGATIVE

## 2017-04-10 SURGERY — DILATATION & CURETTAGE/HYSTEROSCOPY WITH MYOSURE
Anesthesia: General

## 2017-04-10 MED ORDER — SUCCINYLCHOLINE CHLORIDE 200 MG/10ML IV SOSY
PREFILLED_SYRINGE | INTRAVENOUS | Status: AC
Start: 2017-04-10 — End: 2017-04-10
  Filled 2017-04-10: qty 10

## 2017-04-10 MED ORDER — BUPIVACAINE HCL (PF) 0.25 % IJ SOLN
INTRAMUSCULAR | Status: DC | PRN
  Administered 2017-04-10: 9 mL

## 2017-04-10 MED ORDER — DEXAMETHASONE SODIUM PHOSPHATE 10 MG/ML IJ SOLN
INTRAMUSCULAR | Status: DC | PRN
  Administered 2017-04-10: 10 mg via INTRAVENOUS

## 2017-04-10 MED ORDER — LACTATED RINGERS IV SOLN
INTRAVENOUS | Status: DC
  Administered 2017-04-10 (×2): via INTRAVENOUS
  Filled 2017-04-10: qty 1000

## 2017-04-10 MED ORDER — LIDOCAINE HCL 4 % MT SOLN
OROMUCOSAL | Status: DC | PRN
  Administered 2017-04-10: 2.5 mL via TOPICAL

## 2017-04-10 MED ORDER — ROCURONIUM BROMIDE 10 MG/ML (PF) SYRINGE
PREFILLED_SYRINGE | INTRAVENOUS | Status: DC | PRN
  Administered 2017-04-10: 20 mg via INTRAVENOUS

## 2017-04-10 MED ORDER — MIDAZOLAM HCL 2 MG/2ML IJ SOLN
INTRAMUSCULAR | Status: AC
  Filled 2017-04-10: qty 2

## 2017-04-10 MED ORDER — PROPOFOL 10 MG/ML IV BOLUS
INTRAVENOUS | Status: DC | PRN
  Administered 2017-04-10: 150 mg via INTRAVENOUS

## 2017-04-10 MED ORDER — KETOROLAC TROMETHAMINE 30 MG/ML IJ SOLN
INTRAMUSCULAR | Status: DC | PRN
  Administered 2017-04-10: 30 mg via INTRAVENOUS

## 2017-04-10 MED ORDER — ARTIFICIAL TEARS OPHTHALMIC OINT
TOPICAL_OINTMENT | OPHTHALMIC | Status: AC
  Filled 2017-04-10: qty 3.5

## 2017-04-10 MED ORDER — DEXAMETHASONE SODIUM PHOSPHATE 10 MG/ML IJ SOLN
INTRAMUSCULAR | Status: AC
  Filled 2017-04-10: qty 1

## 2017-04-10 MED ORDER — ONDANSETRON HCL 4 MG/2ML IJ SOLN
INTRAMUSCULAR | Status: AC
Start: 2017-04-10 — End: 2017-04-10
  Filled 2017-04-10: qty 2

## 2017-04-10 MED ORDER — EPHEDRINE SULFATE-NACL 50-0.9 MG/10ML-% IV SOSY
PREFILLED_SYRINGE | INTRAVENOUS | Status: DC | PRN
  Administered 2017-04-10 (×2): 10 mg via INTRAVENOUS

## 2017-04-10 MED ORDER — FENTANYL CITRATE (PF) 250 MCG/5ML IJ SOLN
INTRAMUSCULAR | Status: AC
  Filled 2017-04-10: qty 5

## 2017-04-10 MED ORDER — LIDOCAINE HCL 1 % IJ SOLN
INTRAMUSCULAR | Status: DC | PRN
  Administered 2017-04-10: 8 mL

## 2017-04-10 MED ORDER — SUGAMMADEX SODIUM 200 MG/2ML IV SOLN
INTRAVENOUS | Status: DC | PRN
  Administered 2017-04-10: 150 mg via INTRAVENOUS

## 2017-04-10 MED ORDER — PROPOFOL 10 MG/ML IV BOLUS
INTRAVENOUS | Status: DC | PRN
Start: 2017-04-10 — End: 2017-04-10

## 2017-04-10 MED ORDER — SUGAMMADEX SODIUM 200 MG/2ML IV SOLN
INTRAVENOUS | Status: AC
  Filled 2017-04-10: qty 2

## 2017-04-10 MED ORDER — ONDANSETRON HCL 4 MG/2ML IJ SOLN
INTRAMUSCULAR | Status: DC | PRN
  Administered 2017-04-10: 4 mg via INTRAVENOUS

## 2017-04-10 MED ORDER — CEFOTETAN DISODIUM-DEXTROSE 2-2.08 GM-%(50ML) IV SOLR
INTRAVENOUS | Status: AC
  Filled 2017-04-10: qty 50

## 2017-04-10 MED ORDER — SUCCINYLCHOLINE CHLORIDE 200 MG/10ML IV SOSY
PREFILLED_SYRINGE | INTRAVENOUS | Status: DC | PRN
  Administered 2017-04-10: 100 mg via INTRAVENOUS

## 2017-04-10 MED ORDER — MIDAZOLAM HCL 2 MG/2ML IJ SOLN
INTRAMUSCULAR | Status: DC | PRN
Start: 2017-04-10 — End: 2017-04-10
  Administered 2017-04-10: 2 mg via INTRAVENOUS

## 2017-04-10 MED ORDER — LIDOCAINE 2% (20 MG/ML) 5 ML SYRINGE
INTRAMUSCULAR | Status: AC
  Filled 2017-04-10: qty 10

## 2017-04-10 MED ORDER — PROPOFOL 10 MG/ML IV BOLUS
INTRAVENOUS | Status: AC
  Filled 2017-04-10: qty 40

## 2017-04-10 MED ORDER — DEXTROSE 5 % IV SOLN
2.0000 g | INTRAVENOUS | Status: AC
  Administered 2017-04-10: 2 g via INTRAVENOUS
  Filled 2017-04-10: qty 2

## 2017-04-10 MED ORDER — FENTANYL CITRATE (PF) 100 MCG/2ML IJ SOLN
INTRAMUSCULAR | Status: DC | PRN
  Administered 2017-04-10 (×2): 50 ug via INTRAVENOUS

## 2017-04-10 MED ORDER — KETOROLAC TROMETHAMINE 30 MG/ML IJ SOLN
INTRAMUSCULAR | Status: AC
  Filled 2017-04-10: qty 1

## 2017-04-10 MED ORDER — LIDOCAINE 2% (20 MG/ML) 5 ML SYRINGE
INTRAMUSCULAR | Status: DC | PRN
  Administered 2017-04-10: 60 mg via INTRAVENOUS

## 2017-04-10 MED ORDER — SUCCINYLCHOLINE CHLORIDE 200 MG/10ML IV SOSY: PREFILLED_SYRINGE | INTRAVENOUS | Status: DC | PRN

## 2017-04-10 MED ORDER — ROCURONIUM BROMIDE 50 MG/5ML IV SOSY
PREFILLED_SYRINGE | INTRAVENOUS | Status: AC
  Filled 2017-04-10: qty 5

## 2017-04-10 SURGICAL SUPPLY — 64 items
APPLICATOR COTTON TIP 6IN STRL (MISCELLANEOUS) ×3 IMPLANT
BLADE CLIPPER SURG (BLADE) IMPLANT
CANISTER SUCT 3000ML PPV (MISCELLANEOUS) ×3 IMPLANT
CATH FOLEY 2WAY SLVR  5CC 14FR (CATHETERS)
CATH FOLEY 2WAY SLVR 5CC 14FR (CATHETERS) IMPLANT
CATH ROBINSON RED A/P 16FR (CATHETERS) ×3 IMPLANT
CLOTH BEACON ORANGE TIMEOUT ST (SAFETY) ×3 IMPLANT
COUNTER NEEDLE 1200 MAGNETIC (NEEDLE) ×3 IMPLANT
COVER MAYO STAND STRL (DRAPES) ×3 IMPLANT
DERMABOND ADVANCED (GAUZE/BANDAGES/DRESSINGS) ×2
DERMABOND ADVANCED .7 DNX12 (GAUZE/BANDAGES/DRESSINGS) ×4 IMPLANT
DEVICE MYOSURE LITE (MISCELLANEOUS) ×3 IMPLANT
DEVICE MYOSURE REACH (MISCELLANEOUS) IMPLANT
DILATOR CANAL MILEX (MISCELLANEOUS) IMPLANT
DRSG COVADERM PLUS 2X2 (GAUZE/BANDAGES/DRESSINGS) ×6 IMPLANT
DRSG OPSITE POSTOP 3X4 (GAUZE/BANDAGES/DRESSINGS) ×3 IMPLANT
DURAPREP 26ML APPLICATOR (WOUND CARE) ×3 IMPLANT
ELECT REM PT RETURN 9FT ADLT (ELECTROSURGICAL) ×3
ELECTRODE REM PT RTRN 9FT ADLT (ELECTROSURGICAL) ×2 IMPLANT
FILTER ARTHROSCOPY CONVERTOR (FILTER) ×3 IMPLANT
FILTER SMOKE EVAC LAPAROSHD (FILTER) IMPLANT
GLOVE BIO SURGEON STRL SZ7 (GLOVE) ×6 IMPLANT
GLOVE BIOGEL PI IND STRL 7.0 (GLOVE) ×2 IMPLANT
GLOVE BIOGEL PI INDICATOR 7.0 (GLOVE) ×1
GOWN STRL REUS W/ TWL LRG LVL3 (GOWN DISPOSABLE) ×4 IMPLANT
GOWN STRL REUS W/TWL LRG LVL3 (GOWN DISPOSABLE) ×8 IMPLANT
IV NS IRRIG 3000ML ARTHROMATIC (IV SOLUTION) ×6 IMPLANT
KIT RM TURNOVER CYSTO AR (KITS) ×3 IMPLANT
MYOSURE XL FIBROID REM (MISCELLANEOUS)
NEEDLE INSUFFLATION 14GA 120MM (NEEDLE) ×3 IMPLANT
NEEDLE INSUFFLATION 14GA 150MM (NEEDLE) IMPLANT
NS IRRIG 500ML POUR BTL (IV SOLUTION) ×3 IMPLANT
PACK LAPAROSCOPY BASIN (CUSTOM PROCEDURE TRAY) ×3 IMPLANT
PACK TRENDGUARD 450 HYBRID PRO (MISCELLANEOUS) IMPLANT
PACK TRENDGUARD 600 HYBRD PROC (MISCELLANEOUS) IMPLANT
PACK VAGINAL MINOR WOMEN LF (CUSTOM PROCEDURE TRAY) ×3 IMPLANT
PAD OB MATERNITY 4.3X12.25 (PERSONAL CARE ITEMS) ×3 IMPLANT
PAD PREP 24X48 CUFFED NSTRL (MISCELLANEOUS) ×3 IMPLANT
PENCIL BUTTON HOLSTER BLD 10FT (ELECTRODE) IMPLANT
POUCH SPECIMEN RETRIEVAL 10MM (ENDOMECHANICALS) IMPLANT
SCISSORS LAP 5X35 DISP (ENDOMECHANICALS) IMPLANT
SEAL ROD LENS SCOPE MYOSURE (ABLATOR) ×3 IMPLANT
SEALER TISSUE G2 CVD JAW 35 (ENDOMECHANICALS) IMPLANT
SEALER TISSUE G2 CVD JAW 45CM (ENDOMECHANICALS) IMPLANT
SET IRRIG TUBING LAPAROSCOPIC (IRRIGATION / IRRIGATOR) IMPLANT
SHEARS HARMONIC ACE PLUS 45CM (MISCELLANEOUS) IMPLANT
STRIP CLOSURE SKIN 1/4X4 (GAUZE/BANDAGES/DRESSINGS) IMPLANT
SUT VICRYL 0 UR6 27IN ABS (SUTURE) IMPLANT
SUT VICRYL RAPIDE 4/0 PS 2 (SUTURE) ×3 IMPLANT
SYR 3ML 23GX1 SAFETY (SYRINGE) IMPLANT
SYRINGE 10CC LL (SYRINGE) ×3 IMPLANT
SYSTEM TISS REMOVAL MYSR XL RM (MISCELLANEOUS) IMPLANT
TOWEL OR 17X24 6PK STRL BLUE (TOWEL DISPOSABLE) ×6 IMPLANT
TRAY FOLEY CATH SILVER 14FR (SET/KITS/TRAYS/PACK) IMPLANT
TRENDGUARD 450 HYBRID PRO PACK (MISCELLANEOUS)
TRENDGUARD 600 HYBRID PROC PK (MISCELLANEOUS)
TROCAR OPTI TIP 5M 100M (ENDOMECHANICALS) ×3 IMPLANT
TROCAR XCEL BLUNT TIP 100MML (ENDOMECHANICALS) IMPLANT
TROCAR XCEL DIL TIP R 11M (ENDOMECHANICALS) ×3 IMPLANT
TUBING AQUILEX INFLOW (TUBING) ×3 IMPLANT
TUBING AQUILEX OUTFLOW (TUBING) ×6 IMPLANT
TUBING INSUF HEATED (TUBING) ×3 IMPLANT
WARMER LAPAROSCOPE (MISCELLANEOUS) ×3 IMPLANT
WATER STERILE IRR 500ML POUR (IV SOLUTION) ×3 IMPLANT

## 2017-04-10 NOTE — Op Note (Signed)
Preoperative diagnosis chronic pelvic pain, left worse than right, abnormal uterine bleeding, possible endometrial polyp  Postoperative diagnosis: Same plus mild pelvic endometriosis.  Procedure: 1.  Hysteroscopy with Myosure resection of endometrial polyp  2.  Diagnostic laparoscopy, coagulation of left pelvic sidewall endometriosis  Surgeon: Matthew Saras  Anesthesia: General  EBL: Less than 10 cc  Procedure and findings:  The patient was taken to the operating room, after an adequate level of general anesthesia was obtained with legs in stirrups the abdomen perineum and vagina were prepped and draped in the bladder was drained.  Appropriate timeouts were taken at that point.  The uterus itself was mid position, normal size, adnexa negative.  Procedure started with a hysteroscopy portion.  Cervix grasped with a tenaculum and sounded to 9 cm progressively dilated to 27 Pratt dilator, the continuous-flow hysteroscope was then inserted the well-defined left lateral wall polyp was noted, the Myosure light was then used to completely resect the polyp until the cavity was clean and there were no other abnormalities this was all submitted as tissue specimen.  Scope was removed talking tenaculum was positioned. The abdominal portion of the procedure was started at this point the subumbilical area was infiltrated with quarter percent Marcaine plain a small incision was made and the Veress needle was introduced without difficulty.  This was verified by pressure and water testing.  A 2-1/2 L pneumoperitoneum was then created, laparoscopic trocar and sleeve were then introduced the idea of difficulty there was no evidence of any bleeding or trauma.  3 fingerbreadths above the symphysis in the midline a 5 mm trocar was inserted.  The pelvic findings as follows:   The uterus itself is normal size mobile bilateral tubes and ovaries were unremarkable with a mobile event on the posterior side no evidence of adhesions  or endometriosis did not specifically see a hydrosalpinx on the left there was one left pelvic sidewall area of endometriosis no other areas were noted.  The appendix and upper abdomen were otherwise unremarkable.  After these areas were photo documented.  The course of the left ureter was traced out could be seen peristalsing through the peritoneum, the endometriotic implant was several centimeters above this, the bipolar was then used to coagulate it superficially no other endometriotic areas were noted.  Instruments were then removed, gas allowed to escape, the defects closed with 4-0 Monocryl subcuticular at the umbilicus and Dermabond below.  She tolerated this well went to recovery room in good condition.  Dictated with Dragon Medical 1  Betty Asal MD

## 2017-04-10 NOTE — Anesthesia Procedure Notes (Signed)
Procedure Name: Intubation Date/Time: 04/10/2017 7:34 AM Performed by: Myrtie Soman Pre-anesthesia Checklist: Patient identified, Emergency Drugs available, Suction available and Patient being monitored Patient Re-evaluated:Patient Re-evaluated prior to induction Oxygen Delivery Method: Circle system utilized Preoxygenation: Pre-oxygenation with 100% oxygen Induction Type: IV induction Ventilation: Mask ventilation without difficulty Laryngoscope Size: Mac and 3 Grade View: Grade I Tube type: Oral Tube size: 7.0 mm Number of attempts: 1 Airway Equipment and Method: Stylet and LTA kit utilized Placement Confirmation: ETT inserted through vocal cords under direct vision,  positive ETCO2 and breath sounds checked- equal and bilateral Secured at: 20 cm Tube secured with: Tape Dental Injury: Teeth and Oropharynx as per pre-operative assessment

## 2017-04-10 NOTE — Discharge Instructions (Signed)
° °   Home care Instructions:   Personal hygiene:  Used sanitary napkins for vaginal drainage not tampons. Shower or tub bathe the day after your procedure. No douching until bleeding stops. Always wipe from front to back after  Elimination.  Activity: Do not drive or operate any equipment today. The effects of the anesthesia are still present and drowsiness may result. Rest today, not necessarily flat bed rest, just take it easy. You may resume your normal activity in one to 2 days.  Sexual activity: No intercourse for one week or as indicated by your physician  Diet: Eat a light diet as desired this evening. You may resume a regular diet tomorrow.  Return to work: One to 2 days.  General Expectations of your surgery: Vaginal bleeding should be no heavier than a normal period. Spotting may continue up to 10 days. Mild cramps may continue for a couple of days. You may have a regular period in 2-6 weeks.  Unexpected observations call your doctor if these occur: persistent or heavy bleeding. Severe abdominal cramping or pain. Elevation of temperature greater than 100F. ___________________________________________________  Post Anesthesia Home Care Instructions  Activity: Get plenty of rest for the remainder of the day. A responsible individual must stay with you for 24 hours following the procedure.  For the next 24 hours, DO NOT: -Drive a car -Paediatric nurse -Drink alcoholic beverages -Take any medication unless instructed by your physician -Make any legal decisions or sign important papers.  Meals: Start with liquid foods such as gelatin or soup. Progress to regular foods as tolerated. Avoid greasy, spicy, heavy foods. If nausea and/or vomiting occur, drink only clear liquids until the nausea and/or vomiting subsides. Call your physician if vomiting continues.  Special Instructions/Symptoms: Your throat may feel dry or sore from the anesthesia or the breathing tube placed in your  throat during surgery. If this causes discomfort, gargle with warm salt water. The discomfort should disappear within 24 hours.  If you had a scopolamine patch placed behind your ear for the management of post- operative nausea and/or vomiting:  1. The medication in the patch is effective for 72 hours, after which it should be removed.  Wrap patch in a tissue and discard in the trash. Wash hands thoroughly with soap and water. 2. You may remove the patch earlier than 72 hours if you experience unpleasant side effects which may include dry mouth, dizziness or visual disturbances. 3. Avoid touching the patch. Wash your hands with soap and water after contact with the patch.

## 2017-04-10 NOTE — Anesthesia Postprocedure Evaluation (Signed)
Anesthesia Post Note  Patient: Betty Harrington  Procedure(s) Performed: DILATATION & CURETTAGE/HYSTEROSCOPY WITH MYOSURE (N/A ) LAPAROSCOPY DIAGNOSTIC (Left )     Patient location during evaluation: PACU Anesthesia Type: General Level of consciousness: awake and alert Pain management: pain level controlled Vital Signs Assessment: post-procedure vital signs reviewed and stable Respiratory status: spontaneous breathing, nonlabored ventilation, respiratory function stable and patient connected to nasal cannula oxygen Cardiovascular status: blood pressure returned to baseline and stable Postop Assessment: no apparent nausea or vomiting Anesthetic complications: no    Last Vitals:  Vitals:   04/10/17 0850 04/10/17 0900  BP:  115/62  Pulse: 75 73  Resp: 15 13  Temp:    SpO2: 100% 100%    Last Pain:  Vitals:   04/10/17 0840  TempSrc:   PainSc: Asleep                 ROSE,GEORGE S

## 2017-04-10 NOTE — Transfer of Care (Signed)
  Last Vitals:  Vitals:   04/10/17 0537  BP: 114/63  Pulse: 88  Resp: 16  Temp: 36.9 C  SpO2: 100%    Last Pain:  Vitals:   04/10/17 0537  TempSrc: Oral      Patients Stated Pain Goal: 5 (04/10/17 0601)  Immediate Anesthesia Transfer of Care Note  Patient: Betty Harrington  Procedure(s) Performed: Procedure(s) (LRB): DILATATION & CURETTAGE/HYSTEROSCOPY WITH MYOSURE (N/A) LAPAROSCOPY DIAGNOSTIC (Left)  Patient Location: PACU  Anesthesia Type: General  Level of Consciousness: awake, alert  and oriented  Airway & Oxygen Therapy: Patient Spontanous Breathing and Patient connected to nasal cannula oxygen  Post-op Assessment: Report given to PACU RN and Post -op Vital signs reviewed and stable  Post vital signs: Reviewed and stable  Complications: No apparent anesthesia complications

## 2017-04-10 NOTE — Progress Notes (Signed)
The patient was re-examined with no change in status 

## 2017-04-11 ENCOUNTER — Encounter (HOSPITAL_BASED_OUTPATIENT_CLINIC_OR_DEPARTMENT_OTHER): Payer: Self-pay | Admitting: Obstetrics and Gynecology

## 2017-04-11 LAB — POCT HEMOGLOBIN-HEMACUE: Hemoglobin: 11.8 g/dL — ABNORMAL LOW (ref 12.0–15.0)

## 2017-05-30 DIAGNOSIS — Z1231 Encounter for screening mammogram for malignant neoplasm of breast: Secondary | ICD-10-CM | POA: Diagnosis not present

## 2017-05-30 DIAGNOSIS — Z803 Family history of malignant neoplasm of breast: Secondary | ICD-10-CM | POA: Diagnosis not present

## 2017-06-16 DIAGNOSIS — R05 Cough: Secondary | ICD-10-CM | POA: Diagnosis not present

## 2017-06-16 DIAGNOSIS — H6502 Acute serous otitis media, left ear: Secondary | ICD-10-CM | POA: Diagnosis not present

## 2017-07-18 DIAGNOSIS — F419 Anxiety disorder, unspecified: Secondary | ICD-10-CM | POA: Diagnosis not present

## 2017-07-18 DIAGNOSIS — K5900 Constipation, unspecified: Secondary | ICD-10-CM | POA: Diagnosis not present

## 2017-07-18 DIAGNOSIS — E559 Vitamin D deficiency, unspecified: Secondary | ICD-10-CM | POA: Diagnosis not present

## 2017-07-18 DIAGNOSIS — Z0000 Encounter for general adult medical examination without abnormal findings: Secondary | ICD-10-CM | POA: Diagnosis not present

## 2017-07-18 DIAGNOSIS — E782 Mixed hyperlipidemia: Secondary | ICD-10-CM | POA: Diagnosis not present

## 2017-09-08 DIAGNOSIS — E039 Hypothyroidism, unspecified: Secondary | ICD-10-CM | POA: Diagnosis not present

## 2017-09-08 DIAGNOSIS — E221 Hyperprolactinemia: Secondary | ICD-10-CM | POA: Diagnosis not present

## 2017-09-09 DIAGNOSIS — E039 Hypothyroidism, unspecified: Secondary | ICD-10-CM | POA: Diagnosis not present

## 2017-09-15 DIAGNOSIS — E221 Hyperprolactinemia: Secondary | ICD-10-CM | POA: Diagnosis not present

## 2017-09-15 DIAGNOSIS — E063 Autoimmune thyroiditis: Secondary | ICD-10-CM | POA: Diagnosis not present

## 2017-09-15 DIAGNOSIS — E039 Hypothyroidism, unspecified: Secondary | ICD-10-CM | POA: Diagnosis not present

## 2017-11-10 DIAGNOSIS — L814 Other melanin hyperpigmentation: Secondary | ICD-10-CM | POA: Diagnosis not present

## 2017-11-10 DIAGNOSIS — D229 Melanocytic nevi, unspecified: Secondary | ICD-10-CM | POA: Diagnosis not present

## 2017-11-10 DIAGNOSIS — D1801 Hemangioma of skin and subcutaneous tissue: Secondary | ICD-10-CM | POA: Diagnosis not present

## 2017-11-10 DIAGNOSIS — L821 Other seborrheic keratosis: Secondary | ICD-10-CM | POA: Diagnosis not present

## 2017-11-21 DIAGNOSIS — E039 Hypothyroidism, unspecified: Secondary | ICD-10-CM | POA: Diagnosis not present

## 2018-03-23 DIAGNOSIS — Z23 Encounter for immunization: Secondary | ICD-10-CM | POA: Diagnosis not present

## 2018-06-12 DIAGNOSIS — Z803 Family history of malignant neoplasm of breast: Secondary | ICD-10-CM | POA: Diagnosis not present

## 2018-06-12 DIAGNOSIS — Z1231 Encounter for screening mammogram for malignant neoplasm of breast: Secondary | ICD-10-CM | POA: Diagnosis not present

## 2018-07-21 DIAGNOSIS — F329 Major depressive disorder, single episode, unspecified: Secondary | ICD-10-CM | POA: Diagnosis not present

## 2018-07-21 DIAGNOSIS — R5383 Other fatigue: Secondary | ICD-10-CM | POA: Diagnosis not present

## 2018-07-21 DIAGNOSIS — Z0000 Encounter for general adult medical examination without abnormal findings: Secondary | ICD-10-CM | POA: Diagnosis not present

## 2018-07-21 DIAGNOSIS — E782 Mixed hyperlipidemia: Secondary | ICD-10-CM | POA: Diagnosis not present

## 2018-07-21 DIAGNOSIS — F419 Anxiety disorder, unspecified: Secondary | ICD-10-CM | POA: Diagnosis not present

## 2018-07-21 DIAGNOSIS — E559 Vitamin D deficiency, unspecified: Secondary | ICD-10-CM | POA: Diagnosis not present

## 2019-03-08 ENCOUNTER — Encounter: Payer: Self-pay | Admitting: Internal Medicine

## 2019-03-08 ENCOUNTER — Ambulatory Visit: Payer: Self-pay | Admitting: Internal Medicine

## 2019-03-08 ENCOUNTER — Encounter (INDEPENDENT_AMBULATORY_CARE_PROVIDER_SITE_OTHER): Payer: Self-pay

## 2019-03-08 ENCOUNTER — Other Ambulatory Visit: Payer: Self-pay

## 2019-03-08 VITALS — BP 111/70 | HR 87 | Wt 152.1 lb

## 2019-03-08 DIAGNOSIS — E221 Hyperprolactinemia: Secondary | ICD-10-CM | POA: Insufficient documentation

## 2019-03-08 DIAGNOSIS — J45909 Unspecified asthma, uncomplicated: Secondary | ICD-10-CM

## 2019-03-08 DIAGNOSIS — J4520 Mild intermittent asthma, uncomplicated: Secondary | ICD-10-CM

## 2019-03-08 DIAGNOSIS — Z87891 Personal history of nicotine dependence: Secondary | ICD-10-CM

## 2019-03-08 DIAGNOSIS — E063 Autoimmune thyroiditis: Secondary | ICD-10-CM

## 2019-03-08 DIAGNOSIS — Z79899 Other long term (current) drug therapy: Secondary | ICD-10-CM

## 2019-03-08 DIAGNOSIS — Z79890 Hormone replacement therapy (postmenopausal): Secondary | ICD-10-CM

## 2019-03-08 NOTE — Progress Notes (Signed)
   CC: Follow-up hypothyroidism, establish care  HPI:  Betty Harrington is a 43 y.o. very pleasant woman who presented to the internal medicine clinic today to establish care.  She recently was furloughed from her job and unfortunately lost her insurance and due to financial reasons she cannot keep her previous primary care physician and endocrinologist.  Her last visit with her primary care provider at [facility] [place] (Dr. Drema Dallas) was February of this year for which she mentions to me that she had her annual blood work performed and was unremarkable.  Please see problem based charting for further details    Past Medical History:  Diagnosis Date  . Abnormal uterine bleeding   . Asthma    mild  . Bulging lumbar disc   . Cancer [facility]) summer 2016   skin basal on face  . Hypothyroidism   . Immune deficiency disorder (HCC)    hoshimoto's ( autoimmune)   Review of Systems:   Review of Systems  Constitutional: Negative for chills, diaphoresis, fever, malaise/fatigue and weight loss.       13 pound weight gain in about 2 years  HENT: Negative.   Eyes: Negative.   Respiratory: Negative.   Cardiovascular: Negative.   Gastrointestinal: Negative.   Genitourinary: Negative.   Musculoskeletal: Negative.   Skin: Negative.   Neurological: Negative.   Endo/Heme/Allergies: Negative.   Psychiatric/Behavioral: Negative.        Stress about recently losing her job    Family history: Positive for diabetes mellitus, hypertension and other autoimmune disorders.  Denies any family history of thyroid cancer. 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  Recently was furloughed due to the coronavirus pandemic. Social history: Denies alcohol use, former tobacco use but quit about 10 years ago.  Denies illicit drug use or alcohol use.  She has a 15 year old son who recently started college.  She is legally separated from her husband.  Physical  Exam:  Vitals:   03/08/19 1437  BP: 111/70  Pulse: 87  SpO2: 98%  Weight: 152 lb 1.6 oz (69 kg)   Physical Exam  Constitutional: She is oriented to person, place, and time and well-developed, well-nourished, and in no distress. No distress.  HENT:  Head: Normocephalic and atraumatic.  Mouth/Throat: No oropharyngeal exudate.  Eyes: Conjunctivae are normal.  Cardiovascular: Normal rate, regular rhythm and normal heart sounds.  No murmur heard. Pulmonary/Chest: Effort normal and breath sounds normal. No respiratory distress. She has no wheezes.  Abdominal: Soft. Bowel sounds are normal.  Musculoskeletal: Normal range of motion.  Neurological: She is alert and oriented to person, place, and time.  Skin: She is not diaphoretic.  Psychiatric: Mood and affect normal.    Assessment & Plan:   See Encounters Tab for problem based charting.  Patient discussed with Dr. Lynnae January

## 2019-03-08 NOTE — Assessment & Plan Note (Addendum)
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  She currently does not report of headache, vision changes or any breast discharge.  In 2018 MRI of the brain was unremarkable.  Plan: -Continue bromocriptine 2.5 mg daily -Obtain records from Dr. Chalmers Cater (endocrinology)

## 2019-03-08 NOTE — Progress Notes (Signed)
Internal Medicine Clinic Attending  Case discussed with Dr. Eileen Stanford at the time of the visit.  We reviewed the resident's history and exam and pertinent patient test results.  I agree with the assessment, diagnosis, and plan of care documented in the resident's note.  We will need her endocrinologist notes as reading indicates the bromocriptine can be tapered after a year or more of normal prolactin levels.  We will need to determine whether she had microadenoma or macroadenoma and whether she has had her bromocriptine tapered or not.  It is recommended that prolactin levels are obtained at least once a year in a stable patient.

## 2019-03-08 NOTE — Assessment & Plan Note (Signed)
She reports of intermittent use of her albuterol inhaler.  Triggers include exercise, cold air and sometimes seasonal.  Plan: -Continue albuterol inhaler

## 2019-03-08 NOTE — Addendum Note (Signed)
Addended by: Jean Rosenthal on: 03/08/2019 04:24 PM   Modules accepted: Level of Service

## 2019-03-08 NOTE — Patient Instructions (Signed)
Ms. Betty Harrington,  It was a pleasure taking care of your the clinic today.  And I am glad to be your primary doctor.  As we discussed, I am going to obtain lab work today including a TSH and also a prolactin since he said the last time you had it done was about a year ago.  I am going to obtain records from Dr. Drema Dallas and Dr. Chalmers Cater.  I will call you with your lab results.  Take Care! Dr. Eileen Stanford

## 2019-03-08 NOTE — Assessment & Plan Note (Signed)
Hashimoto's disease: Ms. Betty Harrington has a known history of Hashimoto's disease for which she was following up with endocrinologist (Dr. Chalmers Cater).  She did mention of having a positive thyroid antibody test.  She has been maintained on levothyroxine 50 mcg daily without any difficulties.  Currently she denies hair loss, constipation, cold intolerance.  She only mentions about weight gain (about 13 pounds since 2018).  She attributes this to current stressors of being unemployed.  Plan: -Continue levothyroxine 50 mcg daily - Obtain records from Dr. Chalmers Cater (endocrinology)

## 2019-03-09 LAB — PROLACTIN: Prolactin: 28 ng/mL — ABNORMAL HIGH (ref 4.8–23.3)

## 2019-03-09 LAB — TSH: TSH: 4.15 u[IU]/mL (ref 0.450–4.500)

## 2019-03-10 ENCOUNTER — Telehealth: Payer: Self-pay | Admitting: Internal Medicine

## 2019-03-10 ENCOUNTER — Encounter: Payer: Self-pay | Admitting: Internal Medicine

## 2019-03-10 NOTE — Telephone Encounter (Signed)
I have reviewed Betty Harrington's labs and shows unremarkable TSH. Her prolactin level is elevated at 28 though I do not have a baseline. 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 Since her prolactin has not normalized, evidence suggest to continue bromocriptine therapy and obtain prolactin at least once a year. Unsure if her endocrinologist have increased her therapy and her previous visits. Currently awaiting records.

## 2019-03-16 ENCOUNTER — Telehealth: Payer: Self-pay | Admitting: Internal Medicine

## 2019-03-16 NOTE — Telephone Encounter (Signed)
Pt requesting her labs be faxed to her Endocrinologist's (Dr. Chalmers Cater)  office [phone]. Pt's states after receiving her results her PCP stated they would be faxed.  Their office has not rec'd them.

## 2019-03-29 ENCOUNTER — Ambulatory Visit: Payer: Self-pay

## 2019-04-05 ENCOUNTER — Other Ambulatory Visit: Payer: Self-pay | Admitting: *Deleted

## 2019-04-06 MED ORDER — BROMOCRIPTINE MESYLATE 2.5 MG PO TABS: 2.5000 mg | ORAL_TABLET | Freq: Every day | ORAL | 3 refills | Status: DC

## 2019-04-14 ENCOUNTER — Ambulatory Visit: Payer: Self-pay

## 2019-05-21 ENCOUNTER — Other Ambulatory Visit: Payer: Self-pay

## 2019-05-21 DIAGNOSIS — Z20822 Contact with and (suspected) exposure to covid-19: Secondary | ICD-10-CM

## 2019-05-22 LAB — NOVEL CORONAVIRUS, NAA: SARS-CoV-2, NAA: NOT DETECTED

## 2019-07-01 ENCOUNTER — Other Ambulatory Visit: Payer: Self-pay

## 2019-07-01 ENCOUNTER — Other Ambulatory Visit: Payer: Self-pay | Admitting: Internal Medicine

## 2019-07-01 MED ORDER — BROMOCRIPTINE MESYLATE 2.5 MG PO TABS: 2.5000 mg | ORAL_TABLET | Freq: Every day | ORAL | 3 refills | Status: DC

## 2019-11-17 ENCOUNTER — Other Ambulatory Visit: Payer: Self-pay | Admitting: *Deleted

## 2019-11-17 MED ORDER — BROMOCRIPTINE MESYLATE 2.5 MG PO TABS: 2.5000 mg | ORAL_TABLET | Freq: Every day | ORAL | 3 refills | Status: AC

## 2019-12-16 ENCOUNTER — Other Ambulatory Visit: Payer: Self-pay | Admitting: Family Medicine

## 2019-12-16 DIAGNOSIS — N6459 Other signs and symptoms in breast: Secondary | ICD-10-CM

## 2020-01-04 ENCOUNTER — Ambulatory Visit
Admission: RE | Admit: 2020-01-04 | Discharge: 2020-01-04 | Disposition: A | Discharge status: Home / Self Care | Payer: 59 | Source: Ambulatory Visit | Attending: Family Medicine | Admitting: Family Medicine

## 2020-01-04 ENCOUNTER — Other Ambulatory Visit: Payer: Self-pay

## 2020-01-04 DIAGNOSIS — N6459 Other signs and symptoms in breast: Secondary | ICD-10-CM

## 2020-08-25 DIAGNOSIS — N644 Mastodynia: Secondary | ICD-10-CM | POA: Diagnosis not present

## 2020-08-25 DIAGNOSIS — N6341 Unspecified lump in right breast, subareolar: Secondary | ICD-10-CM | POA: Diagnosis not present

## 2020-08-29 ENCOUNTER — Other Ambulatory Visit: Payer: Self-pay | Admitting: Family Medicine

## 2020-08-29 DIAGNOSIS — N63 Unspecified lump in unspecified breast: Secondary | ICD-10-CM

## 2020-09-20 ENCOUNTER — Ambulatory Visit
Admission: RE | Admit: 2020-09-20 | Discharge: 2020-09-20 | Disposition: A | Discharge status: Home / Self Care | Payer: 59 | Source: Ambulatory Visit | Attending: Family Medicine | Admitting: Family Medicine

## 2020-09-20 ENCOUNTER — Other Ambulatory Visit: Payer: Self-pay

## 2020-09-20 ENCOUNTER — Other Ambulatory Visit: Payer: Self-pay | Admitting: Family Medicine

## 2020-09-20 DIAGNOSIS — N63 Unspecified lump in unspecified breast: Secondary | ICD-10-CM

## 2020-09-20 DIAGNOSIS — N644 Mastodynia: Secondary | ICD-10-CM | POA: Diagnosis not present

## 2020-11-01 ENCOUNTER — Encounter: Payer: Self-pay | Admitting: *Deleted

## 2020-11-20 DIAGNOSIS — E039 Hypothyroidism, unspecified: Secondary | ICD-10-CM | POA: Diagnosis not present

## 2020-11-20 DIAGNOSIS — E221 Hyperprolactinemia: Secondary | ICD-10-CM | POA: Diagnosis not present

## 2020-11-27 DIAGNOSIS — E039 Hypothyroidism, unspecified: Secondary | ICD-10-CM | POA: Diagnosis not present

## 2020-11-27 DIAGNOSIS — E221 Hyperprolactinemia: Secondary | ICD-10-CM | POA: Diagnosis not present

## 2020-11-27 DIAGNOSIS — E063 Autoimmune thyroiditis: Secondary | ICD-10-CM | POA: Diagnosis not present

## 2020-11-29 DIAGNOSIS — K116 Mucocele of salivary gland: Secondary | ICD-10-CM | POA: Diagnosis not present

## 2020-12-12 ENCOUNTER — Encounter: Payer: Self-pay | Admitting: *Deleted

## 2020-12-14 DIAGNOSIS — E559 Vitamin D deficiency, unspecified: Secondary | ICD-10-CM | POA: Diagnosis not present

## 2020-12-14 DIAGNOSIS — Z0000 Encounter for general adult medical examination without abnormal findings: Secondary | ICD-10-CM | POA: Diagnosis not present

## 2020-12-14 DIAGNOSIS — E782 Mixed hyperlipidemia: Secondary | ICD-10-CM | POA: Diagnosis not present

## 2020-12-14 DIAGNOSIS — E063 Autoimmune thyroiditis: Secondary | ICD-10-CM | POA: Diagnosis not present

## 2021-02-26 DIAGNOSIS — K649 Unspecified hemorrhoids: Secondary | ICD-10-CM | POA: Diagnosis not present

## 2021-02-26 DIAGNOSIS — R630 Anorexia: Secondary | ICD-10-CM | POA: Diagnosis not present

## 2021-02-26 DIAGNOSIS — R110 Nausea: Secondary | ICD-10-CM | POA: Diagnosis not present

## 2021-04-26 DIAGNOSIS — Z20822 Contact with and (suspected) exposure to covid-19: Secondary | ICD-10-CM | POA: Diagnosis not present

## 2021-05-17 DIAGNOSIS — R350 Frequency of micturition: Secondary | ICD-10-CM | POA: Diagnosis not present

## 2021-06-29 DIAGNOSIS — E039 Hypothyroidism, unspecified: Secondary | ICD-10-CM | POA: Diagnosis not present

## 2021-06-29 DIAGNOSIS — E221 Hyperprolactinemia: Secondary | ICD-10-CM | POA: Diagnosis not present

## 2021-07-05 DIAGNOSIS — Z77011 Contact with and (suspected) exposure to lead: Secondary | ICD-10-CM | POA: Diagnosis not present

## 2021-07-06 DIAGNOSIS — Z77011 Contact with and (suspected) exposure to lead: Secondary | ICD-10-CM | POA: Diagnosis not present

## 2021-09-05 DIAGNOSIS — Z01419 Encounter for gynecological examination (general) (routine) without abnormal findings: Secondary | ICD-10-CM | POA: Diagnosis not present

## 2021-09-05 DIAGNOSIS — Z6825 Body mass index (BMI) 25.0-25.9, adult: Secondary | ICD-10-CM | POA: Diagnosis not present

## 2021-09-05 DIAGNOSIS — Z1151 Encounter for screening for human papillomavirus (HPV): Secondary | ICD-10-CM | POA: Diagnosis not present

## 2021-09-05 DIAGNOSIS — Z124 Encounter for screening for malignant neoplasm of cervix: Secondary | ICD-10-CM | POA: Diagnosis not present

## 2021-09-10 DIAGNOSIS — R1032 Left lower quadrant pain: Secondary | ICD-10-CM | POA: Diagnosis not present

## 2021-09-27 DIAGNOSIS — N840 Polyp of corpus uteri: Secondary | ICD-10-CM | POA: Diagnosis not present

## 2021-09-27 DIAGNOSIS — R9389 Abnormal findings on diagnostic imaging of other specified body structures: Secondary | ICD-10-CM | POA: Diagnosis not present

## 2021-10-08 IMAGING — US US BREAST*L* LIMITED INC AXILLA
1 series · 4 of 4 positions shown · non-contrast
Comparison: Previous exam(s).

CLINICAL DATA: 44-year-old patient presents for evaluation of the
left breast. Thickening palpated in the upper-outer quadrant on
physical exam. The patient has not noticed a change or lump in the
left breast.

EXAM:
DIGITAL DIAGNOSTIC LEFT MAMMOGRAM WITH CAD AND TOMO
ULTRASOUND LEFT BREAST

[Series 1: us breast*left* limited inc axilla · 0.07mm/px · 4 of 4 slices shown]
[im 1/4]
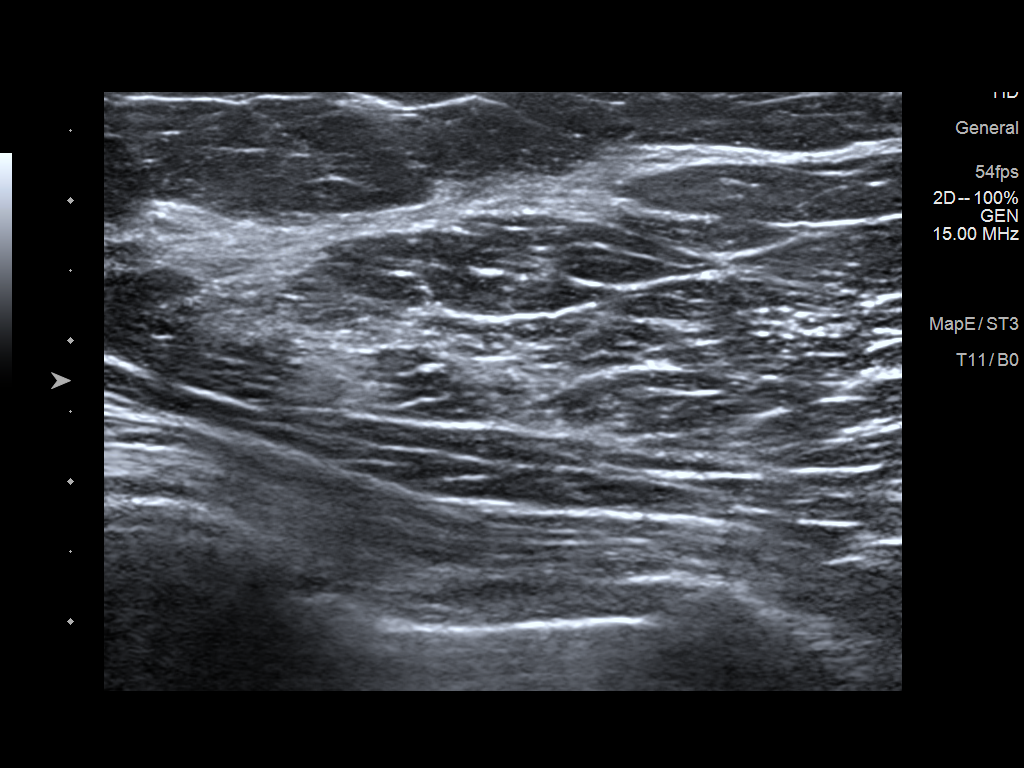
[im 2/4]
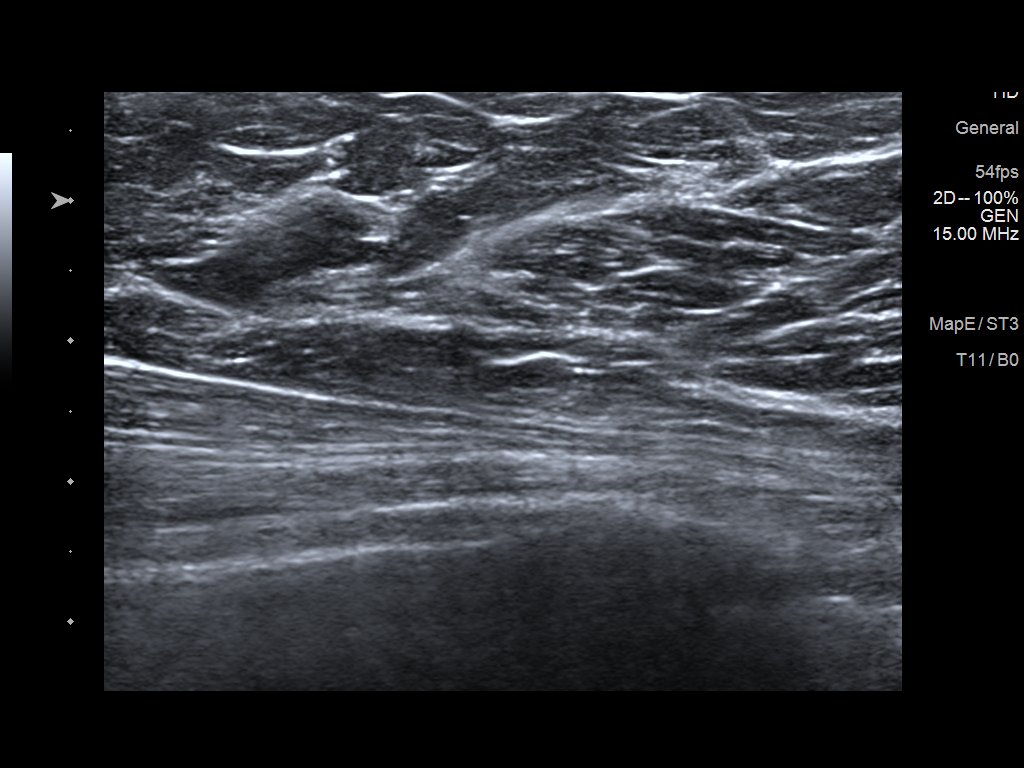
[im 3/4]
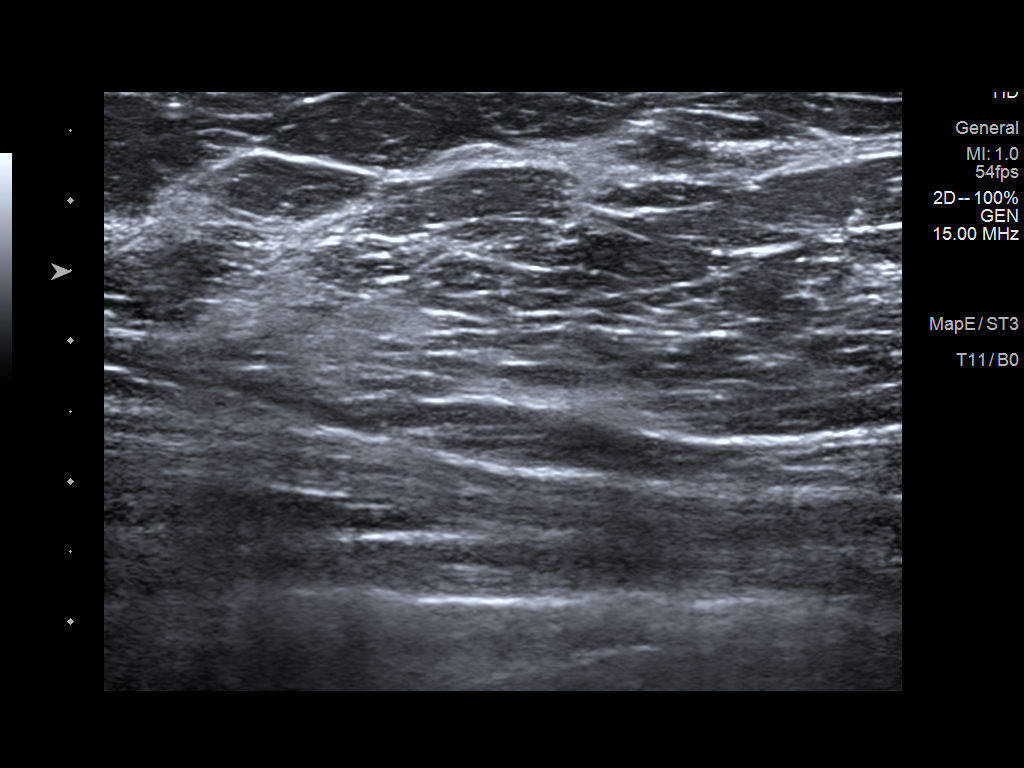
[im 4/4]
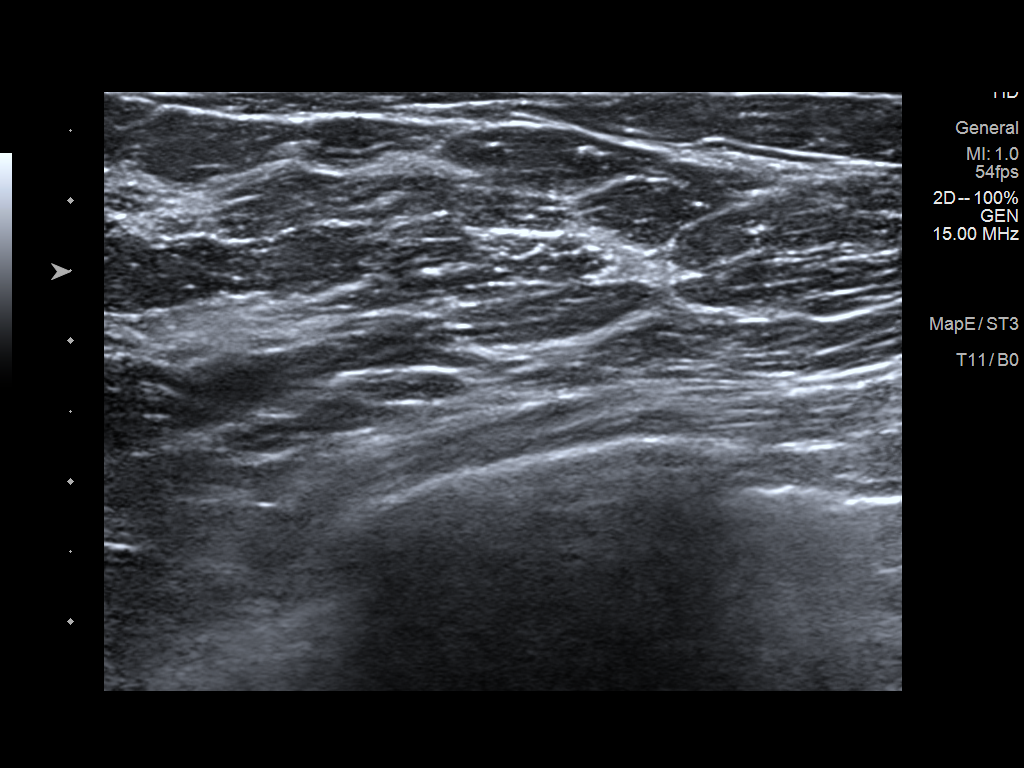

[4 of 4 positions shown; findings below may reference images not displayed]

ACR Breast Density Category c: The breast tissue is heterogeneously
dense, which may obscure small masses.
FINDINGS: No mass, distortion, or suspicious microcalcifications identified in
the left breast to suggest malignancy. Normal skin thickness.

Mammographic images were processed with CAD.

Targeted ultrasound is performed, showing normal fibroglandular
tissue throughout the upper-outer quadrant the left breast. No
suspicious findings on ultrasound.
IMPRESSION: No evidence of malignancy in the left breast.

RECOMMENDATION:
Bilateral screening mammogram recommended in June 2020.

I have discussed the findings and recommendations with the patient.
If applicable, a reminder letter will be sent to the patient
regarding the next appointment.

BI-RADS CATEGORY  1: Negative.

## 2021-10-24 DIAGNOSIS — U071 COVID-19: Secondary | ICD-10-CM | POA: Diagnosis not present

## 2021-10-24 DIAGNOSIS — R059 Cough, unspecified: Secondary | ICD-10-CM | POA: Diagnosis not present

## 2021-10-24 DIAGNOSIS — R509 Fever, unspecified: Secondary | ICD-10-CM | POA: Diagnosis not present

## 2021-10-24 DIAGNOSIS — Z20822 Contact with and (suspected) exposure to covid-19: Secondary | ICD-10-CM | POA: Diagnosis not present

## 2021-11-22 DIAGNOSIS — E039 Hypothyroidism, unspecified: Secondary | ICD-10-CM | POA: Diagnosis not present

## 2021-11-22 DIAGNOSIS — E221 Hyperprolactinemia: Secondary | ICD-10-CM | POA: Diagnosis not present

## 2021-11-22 DIAGNOSIS — U071 COVID-19: Secondary | ICD-10-CM | POA: Diagnosis not present

## 2021-11-23 ENCOUNTER — Other Ambulatory Visit: Payer: Self-pay | Admitting: Family Medicine

## 2021-11-23 DIAGNOSIS — Z1231 Encounter for screening mammogram for malignant neoplasm of breast: Secondary | ICD-10-CM

## 2021-11-28 DIAGNOSIS — N840 Polyp of corpus uteri: Secondary | ICD-10-CM | POA: Diagnosis not present

## 2021-12-05 DIAGNOSIS — Z85828 Personal history of other malignant neoplasm of skin: Secondary | ICD-10-CM | POA: Diagnosis not present

## 2021-12-05 DIAGNOSIS — L738 Other specified follicular disorders: Secondary | ICD-10-CM | POA: Diagnosis not present

## 2021-12-05 DIAGNOSIS — L720 Epidermal cyst: Secondary | ICD-10-CM | POA: Diagnosis not present

## 2021-12-05 DIAGNOSIS — Z08 Encounter for follow-up examination after completed treatment for malignant neoplasm: Secondary | ICD-10-CM | POA: Diagnosis not present

## 2021-12-10 DIAGNOSIS — E221 Hyperprolactinemia: Secondary | ICD-10-CM | POA: Diagnosis not present

## 2021-12-10 DIAGNOSIS — E063 Autoimmune thyroiditis: Secondary | ICD-10-CM | POA: Diagnosis not present

## 2021-12-10 DIAGNOSIS — E039 Hypothyroidism, unspecified: Secondary | ICD-10-CM | POA: Diagnosis not present

## 2021-12-13 ENCOUNTER — Ambulatory Visit
Admission: RE | Admit: 2021-12-13 | Discharge: 2021-12-13 | Disposition: A | Discharge status: Home / Self Care | Payer: BC Managed Care – PPO | Source: Ambulatory Visit | Attending: Family Medicine | Admitting: Family Medicine

## 2021-12-13 DIAGNOSIS — Z1231 Encounter for screening mammogram for malignant neoplasm of breast: Secondary | ICD-10-CM

## 2021-12-17 DIAGNOSIS — E559 Vitamin D deficiency, unspecified: Secondary | ICD-10-CM | POA: Diagnosis not present

## 2021-12-17 DIAGNOSIS — Z0000 Encounter for general adult medical examination without abnormal findings: Secondary | ICD-10-CM | POA: Diagnosis not present

## 2021-12-17 DIAGNOSIS — Z13220 Encounter for screening for lipoid disorders: Secondary | ICD-10-CM | POA: Diagnosis not present

## 2021-12-28 DIAGNOSIS — E039 Hypothyroidism, unspecified: Secondary | ICD-10-CM | POA: Diagnosis not present

## 2021-12-28 DIAGNOSIS — R899 Unspecified abnormal finding in specimens from other organs, systems and tissues: Secondary | ICD-10-CM | POA: Diagnosis not present

## 2021-12-28 DIAGNOSIS — F419 Anxiety disorder, unspecified: Secondary | ICD-10-CM | POA: Diagnosis not present

## 2022-01-11 DIAGNOSIS — M545 Low back pain, unspecified: Secondary | ICD-10-CM | POA: Diagnosis not present

## 2022-02-22 DIAGNOSIS — E782 Mixed hyperlipidemia: Secondary | ICD-10-CM | POA: Diagnosis not present

## 2022-02-22 DIAGNOSIS — E221 Hyperprolactinemia: Secondary | ICD-10-CM | POA: Diagnosis not present

## 2022-02-22 DIAGNOSIS — E559 Vitamin D deficiency, unspecified: Secondary | ICD-10-CM | POA: Diagnosis not present

## 2022-02-22 DIAGNOSIS — E039 Hypothyroidism, unspecified: Secondary | ICD-10-CM | POA: Diagnosis not present

## 2022-03-20 DIAGNOSIS — M5126 Other intervertebral disc displacement, lumbar region: Secondary | ICD-10-CM | POA: Diagnosis not present

## 2022-03-20 DIAGNOSIS — Z6826 Body mass index (BMI) 26.0-26.9, adult: Secondary | ICD-10-CM | POA: Diagnosis not present

## 2022-05-07 DIAGNOSIS — R300 Dysuria: Secondary | ICD-10-CM | POA: Diagnosis not present

## 2022-05-17 DIAGNOSIS — L290 Pruritus ani: Secondary | ICD-10-CM | POA: Diagnosis not present

## 2022-05-17 DIAGNOSIS — Z87440 Personal history of urinary (tract) infections: Secondary | ICD-10-CM | POA: Diagnosis not present

## 2022-06-19 DIAGNOSIS — E221 Hyperprolactinemia: Secondary | ICD-10-CM | POA: Diagnosis not present

## 2022-06-19 DIAGNOSIS — E039 Hypothyroidism, unspecified: Secondary | ICD-10-CM | POA: Diagnosis not present

## 2022-06-19 DIAGNOSIS — E063 Autoimmune thyroiditis: Secondary | ICD-10-CM | POA: Diagnosis not present

## 2022-06-25 IMAGING — US US BREAST*R* LIMITED INC AXILLA
1 series · 5 of 5 positions shown · non-contrast
Comparison: Previous exam(s).

CLINICAL DATA: Patient describes RIGHT breast pain. Patient states
that clinician identified a palpable lump within the subareolar
RIGHT breast corresponding to the area of pain.

EXAM:
DIGITAL DIAGNOSTIC BILATERAL MAMMOGRAM WITH TOMOSYNTHESIS AND CAD;
ULTRASOUND RIGHT BREAST LIMITED
TECHNIQUE: Bilateral digital diagnostic mammography and breast tomosynthesis
was performed. The images were evaluated with computer-aided
detection.; Targeted ultrasound examination of the right breast was
performed

[Series 1: us breast*right* limited inc axilla · 0.06mm/px · 5 of 5 slices shown]
[im 1/5]
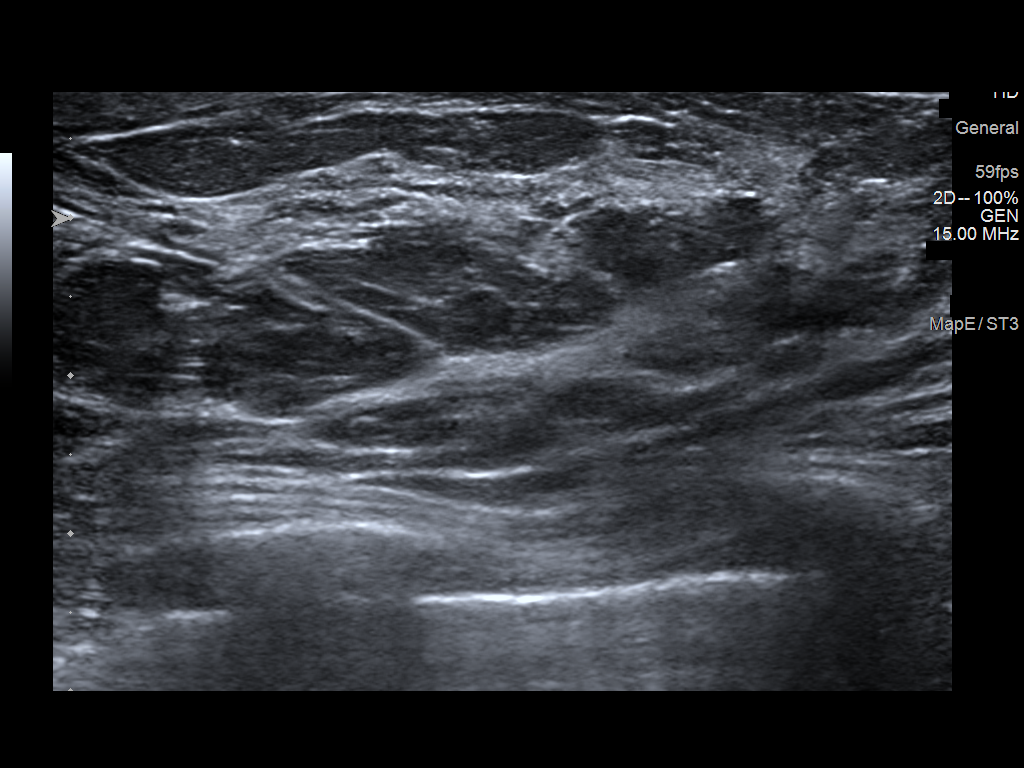
[im 2/5]
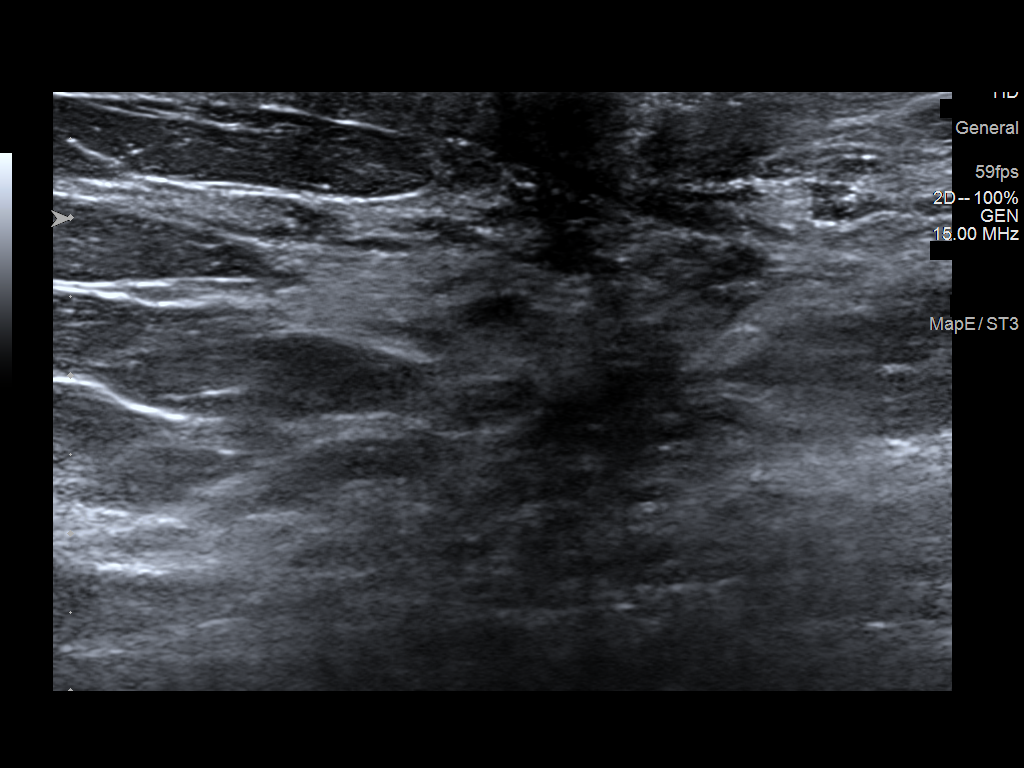
[im 3/5]
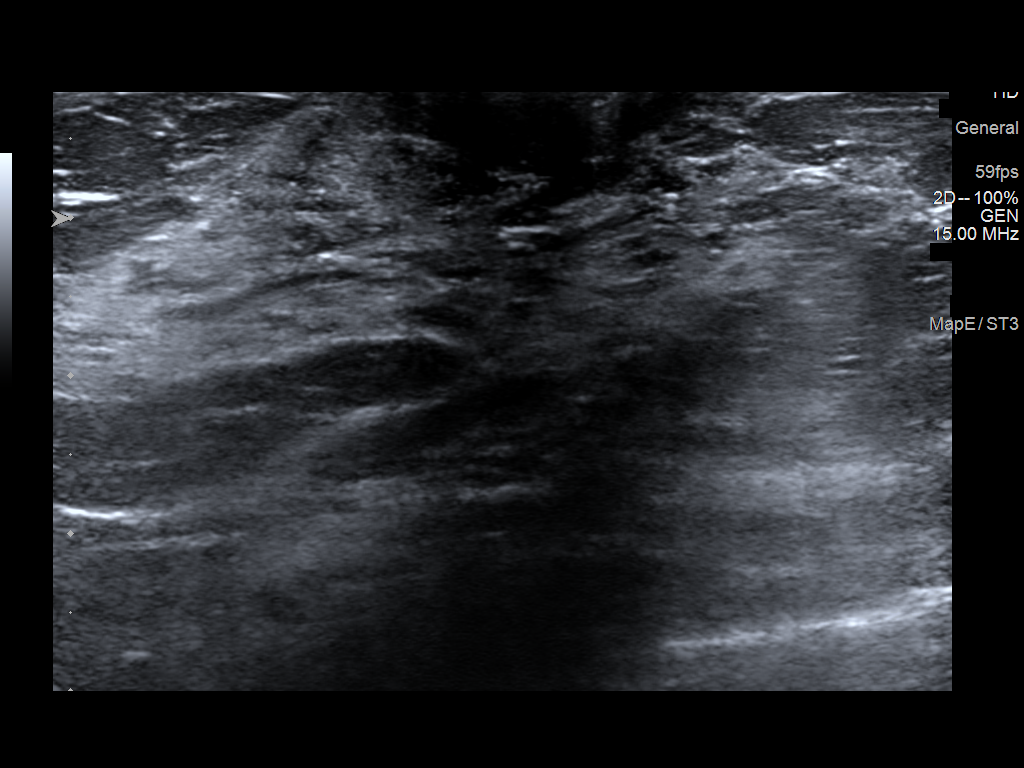
[im 4/5]
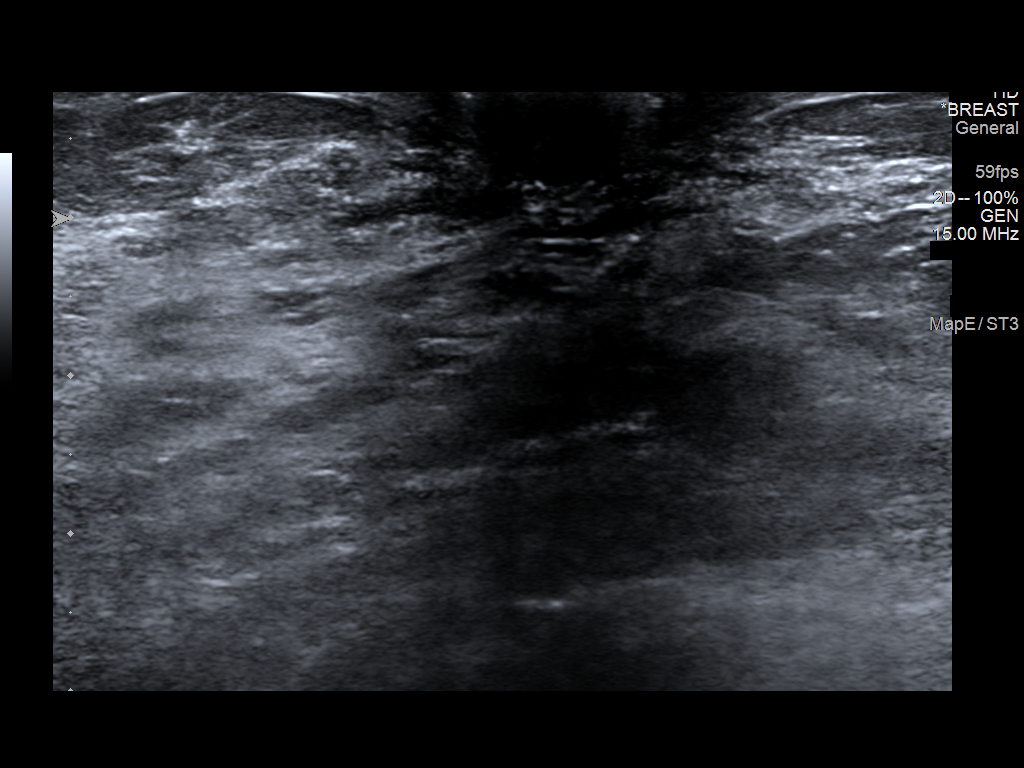
[im 5/5]
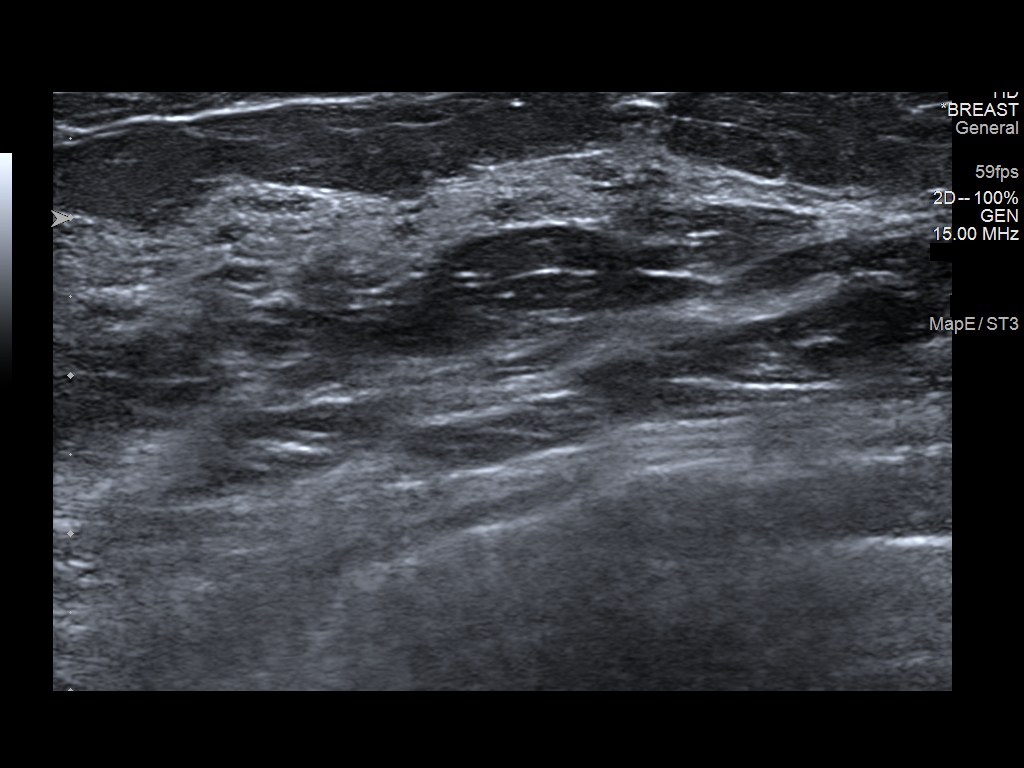

[5 of 5 positions shown; findings below may reference images not displayed]

ACR Breast Density Category c: The breast tissue is heterogeneously
dense, which may obscure small masses.
FINDINGS: Bilateral diagnostic mammogram: There are no dominant masses,
suspicious calcifications or secondary signs of malignancy within
either breast. Specifically, there is no mammographic abnormality
within the subareolar or periareolar RIGHT breast corresponding to
the area of clinical concern.

Targeted ultrasound is performed, evaluating the subareolar and
inferior periareolar RIGHT breast as directed by the patient,
showing only normal fibroglandular tissues and fat lobules
throughout. No solid or cystic mass.
IMPRESSION: No evidence of malignancy within either breast. Specifically, no
mammographic or sonographic evidence of malignancy within the
subareolar or inferior periareolar RIGHT breast corresponding to the
areas of clinical concern.

RECOMMENDATION:
1.  Screening mammogram in one year.(Code:OX-H-8ZY)
2. Benign causes of breast pain were discussed with the patient.
Patient was encouraged to follow-up with referring physician and/or
return for additional imaging if a palpable lump/mass developed.

I have discussed the findings and recommendations with the patient.
If applicable, a reminder letter will be sent to the patient
regarding the next appointment.

BI-RADS CATEGORY  1: Negative.

## 2022-06-25 IMAGING — MG DIGITAL DIAGNOSTIC BILAT W/ TOMO W/ CAD
8 of 14 series · 8 of 40 positions shown · non-contrast
Comparison: Previous exam(s).

CLINICAL DATA: Patient describes RIGHT breast pain. Patient states
that clinician identified a palpable lump within the subareolar
RIGHT breast corresponding to the area of pain.

EXAM:
DIGITAL DIAGNOSTIC BILATERAL MAMMOGRAM WITH TOMOSYNTHESIS AND CAD;
ULTRASOUND RIGHT BREAST LIMITED
TECHNIQUE: Bilateral digital diagnostic mammography and breast tomosynthesis
was performed. The images were evaluated with computer-aided
detection.; Targeted ultrasound examination of the right breast was
performed

[R MLO synth-2D (1 of 2)]
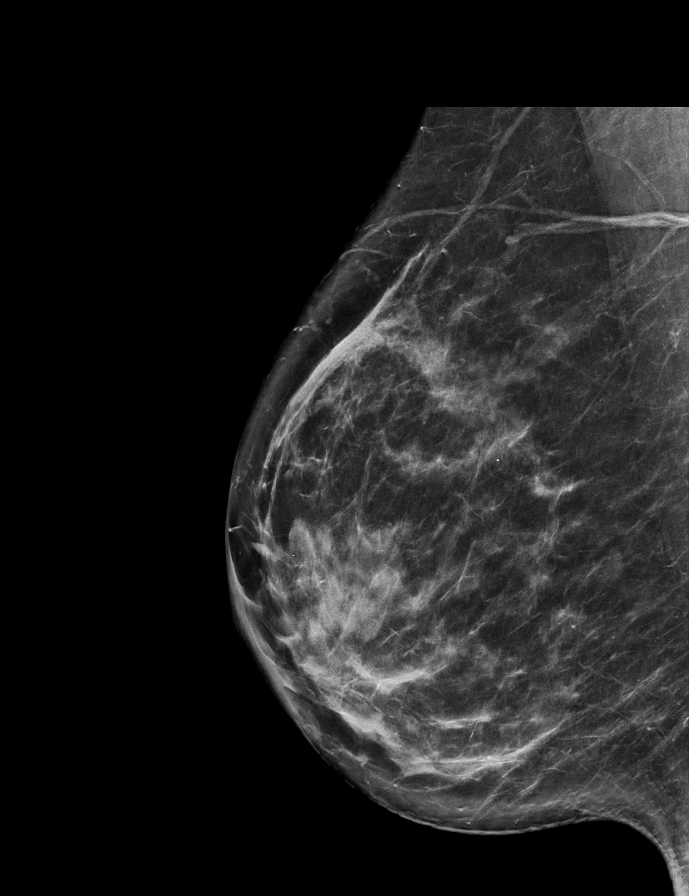

[L CC synth-2D]
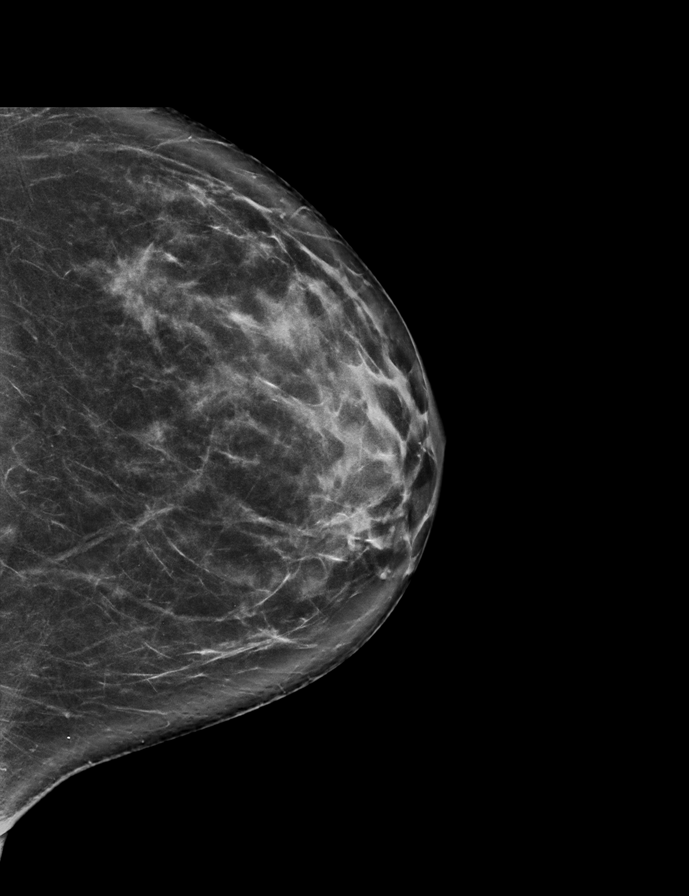

[L MLO synth-2D]
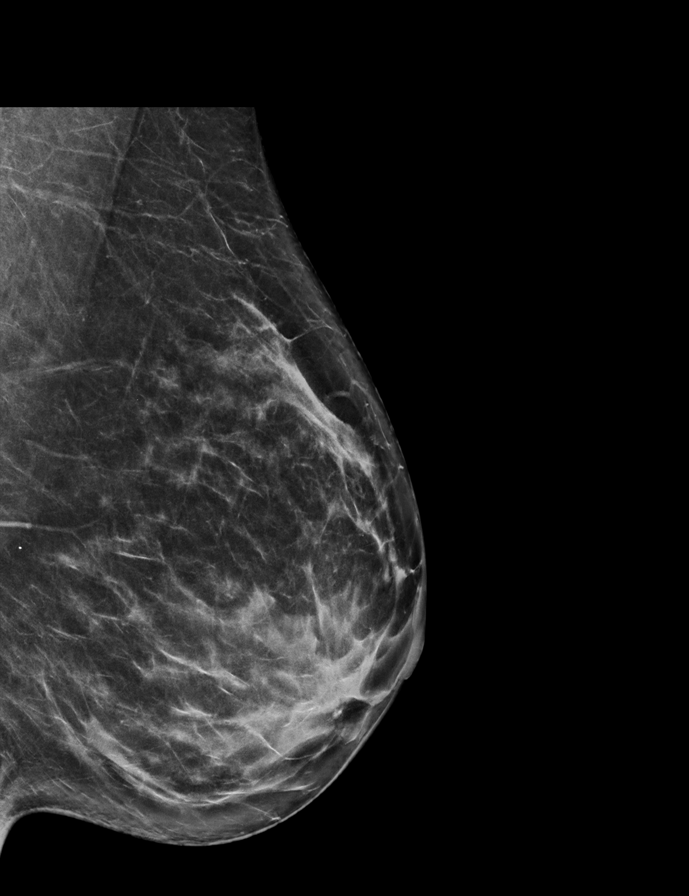

[R CC synth-2D (1 of 3)]
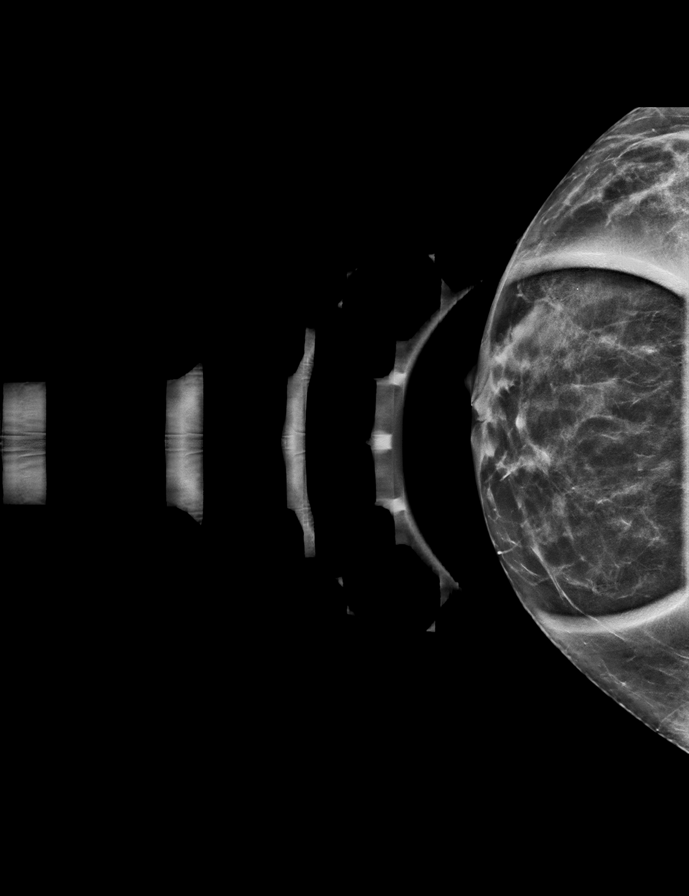

[R MLO synth-2D (2 of 2)]
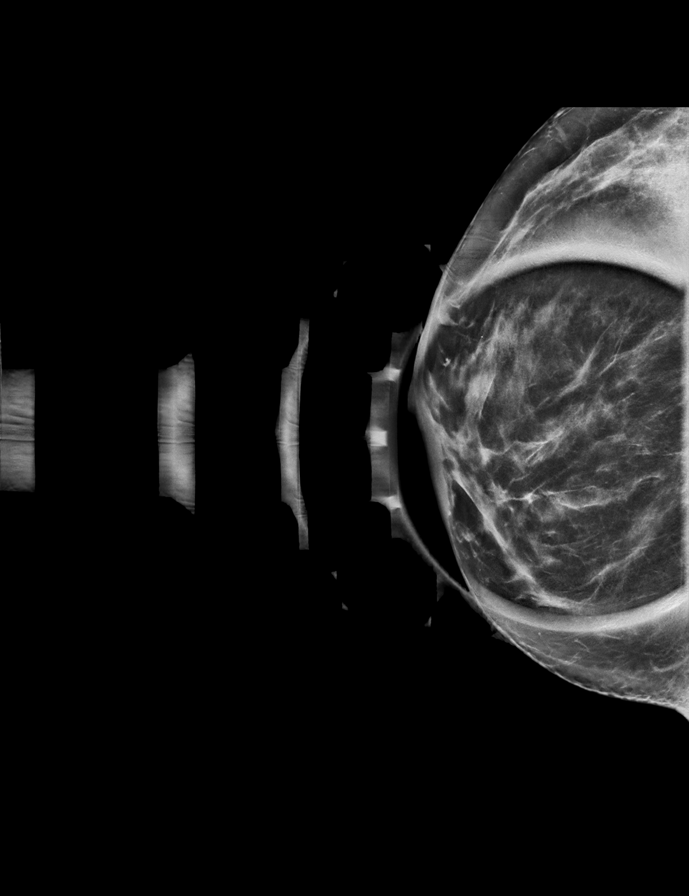

[R CC synth-2D (2 of 3)]
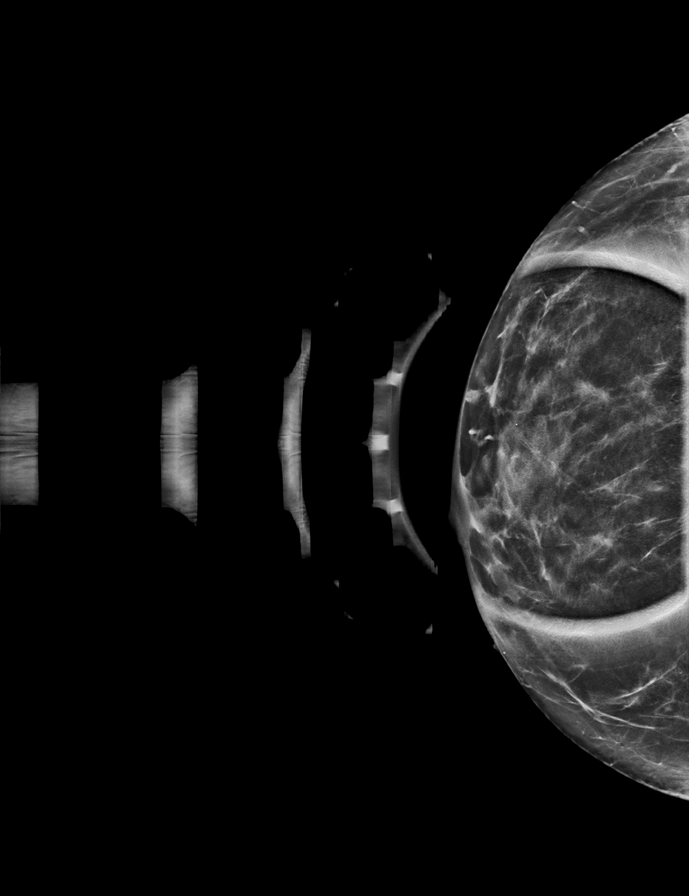

[R CC synth-2D (3 of 3)]
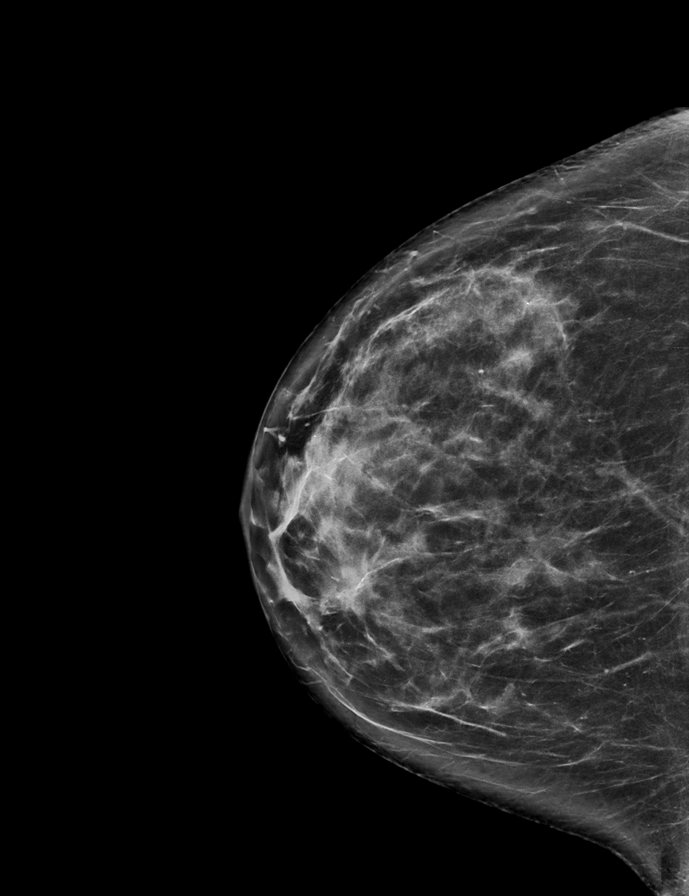

[L MLO tomo · tomo slice 35/70.0]
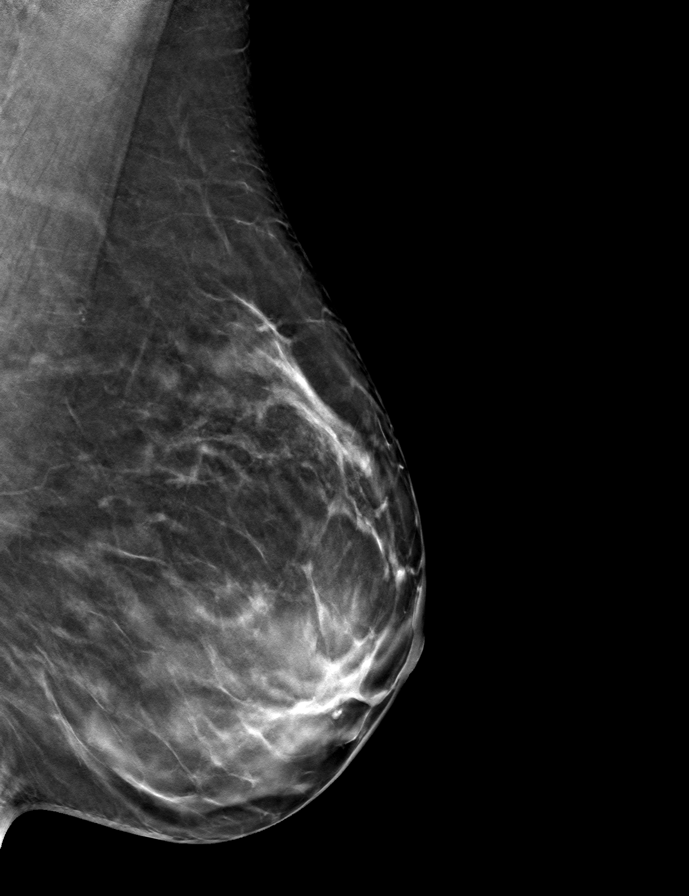

[8 of 40 positions shown; findings below may reference images not displayed]

ACR Breast Density Category c: The breast tissue is heterogeneously
dense, which may obscure small masses.
FINDINGS: Bilateral diagnostic mammogram: There are no dominant masses,
suspicious calcifications or secondary signs of malignancy within
either breast. Specifically, there is no mammographic abnormality
within the subareolar or periareolar RIGHT breast corresponding to
the area of clinical concern.

Targeted ultrasound is performed, evaluating the subareolar and
inferior periareolar RIGHT breast as directed by the patient,
showing only normal fibroglandular tissues and fat lobules
throughout. No solid or cystic mass.
IMPRESSION: No evidence of malignancy within either breast. Specifically, no
mammographic or sonographic evidence of malignancy within the
subareolar or inferior periareolar RIGHT breast corresponding to the
areas of clinical concern.

RECOMMENDATION:
1.  Screening mammogram in one year.(Code:[id number])
2. Benign causes of breast pain were discussed with the patient.
Patient was encouraged to follow-up with referring physician and/or
return for additional imaging if a palpable lump/mass developed.

I have discussed the findings and recommendations with the patient.
If applicable, a reminder letter will be sent to the patient
regarding the next appointment.

BI-RADS CATEGORY  1: Negative.

## 2022-06-27 DIAGNOSIS — Z1211 Encounter for screening for malignant neoplasm of colon: Secondary | ICD-10-CM | POA: Diagnosis not present

## 2022-06-27 DIAGNOSIS — K219 Gastro-esophageal reflux disease without esophagitis: Secondary | ICD-10-CM | POA: Diagnosis not present

## 2022-06-27 DIAGNOSIS — K644 Residual hemorrhoidal skin tags: Secondary | ICD-10-CM | POA: Diagnosis not present

## 2022-08-09 DIAGNOSIS — K2210 Ulcer of esophagus without bleeding: Secondary | ICD-10-CM | POA: Diagnosis not present

## 2022-08-09 DIAGNOSIS — K649 Unspecified hemorrhoids: Secondary | ICD-10-CM | POA: Diagnosis not present

## 2022-08-09 DIAGNOSIS — D128 Benign neoplasm of rectum: Secondary | ICD-10-CM | POA: Diagnosis not present

## 2022-08-09 DIAGNOSIS — K219 Gastro-esophageal reflux disease without esophagitis: Secondary | ICD-10-CM | POA: Diagnosis not present

## 2022-08-09 DIAGNOSIS — K5730 Diverticulosis of large intestine without perforation or abscess without bleeding: Secondary | ICD-10-CM | POA: Diagnosis not present

## 2022-08-09 DIAGNOSIS — Z1211 Encounter for screening for malignant neoplasm of colon: Secondary | ICD-10-CM | POA: Diagnosis not present

## 2022-08-09 DIAGNOSIS — K449 Diaphragmatic hernia without obstruction or gangrene: Secondary | ICD-10-CM | POA: Diagnosis not present

## 2022-08-09 DIAGNOSIS — K259 Gastric ulcer, unspecified as acute or chronic, without hemorrhage or perforation: Secondary | ICD-10-CM | POA: Diagnosis not present

## 2022-08-09 DIAGNOSIS — K2930 Chronic superficial gastritis without bleeding: Secondary | ICD-10-CM | POA: Diagnosis not present

## 2022-08-27 ENCOUNTER — Other Ambulatory Visit: Payer: Self-pay | Admitting: Family Medicine

## 2022-08-27 DIAGNOSIS — Z1231 Encounter for screening mammogram for malignant neoplasm of breast: Secondary | ICD-10-CM

## 2022-10-09 DIAGNOSIS — Z01419 Encounter for gynecological examination (general) (routine) without abnormal findings: Secondary | ICD-10-CM | POA: Diagnosis not present

## 2022-10-09 DIAGNOSIS — Z6827 Body mass index (BMI) 27.0-27.9, adult: Secondary | ICD-10-CM | POA: Diagnosis not present

## 2022-10-09 DIAGNOSIS — Z1151 Encounter for screening for human papillomavirus (HPV): Secondary | ICD-10-CM | POA: Diagnosis not present

## 2022-10-09 DIAGNOSIS — Z124 Encounter for screening for malignant neoplasm of cervix: Secondary | ICD-10-CM | POA: Diagnosis not present

## 2022-10-09 DIAGNOSIS — R319 Hematuria, unspecified: Secondary | ICD-10-CM | POA: Diagnosis not present

## 2022-10-14 DIAGNOSIS — E221 Hyperprolactinemia: Secondary | ICD-10-CM | POA: Diagnosis not present

## 2022-10-14 DIAGNOSIS — E039 Hypothyroidism, unspecified: Secondary | ICD-10-CM | POA: Diagnosis not present

## 2022-10-17 DIAGNOSIS — E221 Hyperprolactinemia: Secondary | ICD-10-CM | POA: Diagnosis not present

## 2022-10-17 DIAGNOSIS — E063 Autoimmune thyroiditis: Secondary | ICD-10-CM | POA: Diagnosis not present

## 2022-10-17 DIAGNOSIS — E039 Hypothyroidism, unspecified: Secondary | ICD-10-CM | POA: Diagnosis not present

## 2022-10-22 ENCOUNTER — Other Ambulatory Visit: Payer: Self-pay | Admitting: Endocrinology

## 2022-10-22 DIAGNOSIS — E063 Autoimmune thyroiditis: Secondary | ICD-10-CM

## 2022-10-23 DIAGNOSIS — R8279 Other abnormal findings on microbiological examination of urine: Secondary | ICD-10-CM | POA: Diagnosis not present

## 2022-10-25 ENCOUNTER — Ambulatory Visit
Admission: RE | Admit: 2022-10-25 | Discharge: 2022-10-25 | Disposition: A | Discharge status: Home / Self Care | Payer: BC Managed Care – PPO | Source: Ambulatory Visit | Attending: Endocrinology | Admitting: Endocrinology

## 2022-10-25 DIAGNOSIS — E063 Autoimmune thyroiditis: Secondary | ICD-10-CM | POA: Diagnosis not present

## 2022-11-11 DIAGNOSIS — M25511 Pain in right shoulder: Secondary | ICD-10-CM | POA: Diagnosis not present

## 2022-11-11 DIAGNOSIS — Z6826 Body mass index (BMI) 26.0-26.9, adult: Secondary | ICD-10-CM | POA: Diagnosis not present

## 2022-12-16 ENCOUNTER — Ambulatory Visit: Payer: BC Managed Care – PPO

## 2022-12-18 DIAGNOSIS — R197 Diarrhea, unspecified: Secondary | ICD-10-CM | POA: Diagnosis not present

## 2022-12-19 ENCOUNTER — Ambulatory Visit
Admission: RE | Admit: 2022-12-19 | Discharge: 2022-12-19 | Disposition: A | Discharge status: Home / Self Care | Payer: BC Managed Care – PPO | Source: Ambulatory Visit | Attending: Family Medicine | Admitting: Family Medicine

## 2022-12-19 DIAGNOSIS — Z1231 Encounter for screening mammogram for malignant neoplasm of breast: Secondary | ICD-10-CM | POA: Diagnosis not present

## 2022-12-22 ENCOUNTER — Encounter (HOSPITAL_BASED_OUTPATIENT_CLINIC_OR_DEPARTMENT_OTHER): Payer: Self-pay | Admitting: Emergency Medicine

## 2022-12-22 ENCOUNTER — Emergency Department (HOSPITAL_BASED_OUTPATIENT_CLINIC_OR_DEPARTMENT_OTHER)
Admission: EM | Admit: 2022-12-22 | Discharge: 2022-12-22 | Disposition: A | Discharge status: Home / Self Care | Payer: BC Managed Care – PPO | Attending: Emergency Medicine | Admitting: Emergency Medicine

## 2022-12-22 ENCOUNTER — Other Ambulatory Visit: Payer: Self-pay

## 2022-12-22 DIAGNOSIS — R197 Diarrhea, unspecified: Secondary | ICD-10-CM | POA: Diagnosis not present

## 2022-12-22 DIAGNOSIS — B829 Intestinal parasitism, unspecified: Secondary | ICD-10-CM | POA: Diagnosis not present

## 2022-12-22 DIAGNOSIS — Z91040 Latex allergy status: Secondary | ICD-10-CM | POA: Insufficient documentation

## 2022-12-22 DIAGNOSIS — K921 Melena: Secondary | ICD-10-CM | POA: Diagnosis not present

## 2022-12-22 LAB — CBC WITH DIFFERENTIAL/PLATELET
Abs Immature Granulocytes: 0.02 10*3/uL (ref 0.00–0.07)
Basophils Absolute: 0.1 10*3/uL (ref 0.0–0.1)
Basophils Relative: 1 %
Eosinophils Absolute: 0.2 10*3/uL (ref 0.0–0.5)
Eosinophils Relative: 4 %
HCT: 38.5 % (ref 36.0–46.0)
Hemoglobin: 12.5 g/dL (ref 12.0–15.0)
Immature Granulocytes: 0 %
Lymphocytes Relative: 31 %
Lymphs Abs: 1.8 10*3/uL (ref 0.7–4.0)
MCH: 28.4 pg (ref 26.0–34.0)
MCHC: 32.5 g/dL (ref 30.0–36.0)
MCV: 87.5 fL (ref 80.0–100.0)
Monocytes Absolute: 0.6 10*3/uL (ref 0.1–1.0)
Monocytes Relative: 10 %
Neutro Abs: 3.2 10*3/uL (ref 1.7–7.7)
Neutrophils Relative %: 54 %
Platelets: 235 10*3/uL (ref 150–400)
RBC: 4.4 MIL/uL (ref 3.87–5.11)
RDW: 13.2 % (ref 11.5–15.5)
WBC: 5.9 10*3/uL (ref 4.0–10.5)
nRBC: 0 % (ref 0.0–0.2)

## 2022-12-22 LAB — URINALYSIS, ROUTINE W REFLEX MICROSCOPIC
Bacteria, UA: NONE SEEN
Bilirubin Urine: NEGATIVE
Glucose, UA: NEGATIVE mg/dL
Ketones, ur: NEGATIVE mg/dL
Leukocytes,Ua: NEGATIVE
Nitrite: NEGATIVE
Protein, ur: NEGATIVE mg/dL
Specific Gravity, Urine: 1.023 (ref 1.005–1.030)
pH: 6 (ref 5.0–8.0)

## 2022-12-22 LAB — COMPREHENSIVE METABOLIC PANEL
ALT: 17 U/L (ref 0–44)
AST: 16 U/L (ref 15–41)
Albumin: 4.6 g/dL (ref 3.5–5.0)
Alkaline Phosphatase: 54 U/L (ref 38–126)
Anion gap: 9 (ref 5–15)
BUN: 13 mg/dL (ref 6–20)
CO2: 23 mmol/L (ref 22–32)
Calcium: 9.6 mg/dL (ref 8.9–10.3)
Chloride: 106 mmol/L (ref 98–111)
Creatinine, Ser: 0.74 mg/dL (ref 0.44–1.00)
GFR, Estimated: >60 mL/min (ref >60)
Glucose, Bld: 90 mg/dL (ref 70–99)
Potassium: 3.6 mmol/L (ref 3.5–5.1)
Sodium: 138 mmol/L (ref 135–145)
Total Bilirubin: 0.5 mg/dL (ref 0.3–1.2)
Total Protein: 6.9 g/dL (ref 6.5–8.1)

## 2022-12-22 LAB — PREGNANCY, URINE: Preg Test, Ur: NEGATIVE

## 2022-12-22 LAB — LIPASE, BLOOD: Lipase: 41 U/L (ref 11–51)

## 2022-12-22 MED ORDER — ALBENDAZOLE 200 MG PO TABS: 400.0000 mg | ORAL_TABLET | Freq: Once | ORAL | 0 refills | Status: AC

## 2022-12-22 NOTE — ED Triage Notes (Signed)
Pt arrives to ED with c/o on-going diarrhea since 7/11. Family member with similar issues. Started immediately after eating at [employer]. Pt notes possible parasite in stool.

## 2022-12-22 NOTE — ED Notes (Addendum)
Pt refused further V/S... Discharge paperwork given and verbally understood... Pt appeared to stable upon leaving... No complaint of N/V, dizziness/feeling faint or any other aliment upon discharge.Marland KitchenMarland Kitchen

## 2022-12-22 NOTE — Discharge Instructions (Addendum)
It was a pleasure taking care of you.  You were started on albendazole, Take as prescribed.  Keep close follow-up with your primary care provider for further evaluation

## 2022-12-22 NOTE — ED Provider Notes (Signed)
[facility] AT [facility] Provider Note   CSN: [id number] Arrival date & time: 12/22/22  1619    History  Chief Complaint  Patient presents with   Diarrhea    Betty Harrington is a 47 y.o. female here for evaluation of diarrhea and possible parasites in stool.  Stated her and her son went out to eat on Thursday.  They both suffered loose stool that night.  The next night they went out for [place] food.  States she is having multiple bowel movements without any blood.  She noted in her recent stool that she had possible worms.  She brings these to bedside.  States she was seen by urgent care earlier in the week and had a negative rectal exam did not have any blood at that time.  Some abdominal cramping with bowel movements.  No fever, vomiting, back pain, dysuria.  No recent travel or antibiotics.  HPI     Home Medications Prior to Admission medications   Medication Sig Start Date End Date Taking? Authorizing Provider  albendazole (ALBENZA) 200 MG tablet Take 2 tablets (400 mg total) by mouth once for 1 dose. Take 2 tablets by mouth once on empty stomach.  Repeat on day #3 if still having symptoms 12/22/22 12/22/22 Yes Rikayla Demmon A, PA-C  albuterol (PROVENTIL HFA;VENTOLIN HFA) 108 (90 BASE) MCG/ACT inhaler Inhale 2 puffs into the lungs every 6 (six) hours as needed for wheezing.    [provider]  albuterol (VENTOLIN HFA) 108 (90 Base) MCG/ACT inhaler INHALE 2 PUFFS EVERY 4 TO 6 HOURS AS NEEDED 10/27/18   [provider]  bromocriptine (PARLODEL) 2.5 MG tablet Take 1 tablet (2.5 mg total) by mouth at bedtime. 11/17/19   Agyei, Hermina Staggers, MD  EUTHYROX 50 MCG tablet TAKE 1 TABLET BY MOUTH ONCE DAILY IN THE MORNING ON AN EMPTY STOMACH 03/03/19   [provider]  levothyroxine (SYNTHROID, LEVOTHROID) 50 MCG tablet Take 50 mcg by mouth daily.  02/04/14   [provider]      Allergies    Azithromycin, Guaifenesin,  Oxycodone-acetaminophen, Acetaminophen, Sulfamethoxazole-trimethoprim, Tussionex pennkinetic er [hydrocod poli-chlorphe poli er], Codeine, Dilaudid [hydromorphone hcl], Latex, Oxycodone, and Sulfa antibiotics    Review of Systems   Review of Systems  Constitutional: Negative.   HENT: Negative.    Respiratory: Negative.    Cardiovascular: Negative.   Gastrointestinal:  Positive for diarrhea. Negative for abdominal distention, abdominal pain, anal bleeding, blood in stool, constipation, nausea, rectal pain and vomiting.  Genitourinary: Negative.   Musculoskeletal: Negative.   Skin: Negative.   Neurological: Negative.   All other systems reviewed and are negative.   Physical Exam Updated Vital Signs BP (!) 154/79 (BP Location: Right Arm)   Pulse 80   Temp 98.1 F (36.7 C) (Temporal)   Resp 15   Ht 5\' 2"  (1.575 m)   Wt 67.1 kg   SpO2 100%   BMI 27.07 kg/m  Physical Exam Vitals and nursing note reviewed.  Constitutional:      General: She is not in acute distress.    Appearance: She is well-developed. She is not ill-appearing, toxic-appearing or diaphoretic.  HENT:     Head: Atraumatic.  Eyes:     Pupils: Pupils are equal, round, and reactive to light.  Cardiovascular:     Rate and Rhythm: Normal rate.     Pulses: Normal pulses.     Heart sounds: Normal heart sounds.  Pulmonary:     Effort:  Pulmonary effort is normal. No respiratory distress.     Breath sounds: Normal breath sounds.  Abdominal:     General: Bowel sounds are normal. There is no distension.     Palpations: Abdomen is soft.  Musculoskeletal:        General: Normal range of motion.     Cervical back: Normal range of motion.  Skin:    General: Skin is warm and dry.     Capillary Refill: Capillary refill takes less than 2 seconds.  Neurological:     General: No focal deficit present.     Mental Status: She is alert.  Psychiatric:        Mood and Affect: Mood normal.     ED Results / Procedures /  Treatments   Labs (all labs ordered are listed, but only abnormal results are displayed) Labs Reviewed  URINALYSIS, ROUTINE W REFLEX MICROSCOPIC - Abnormal; Notable for the following components:      Result Value   Hgb urine dipstick SMALL (*)    All other components within normal limits  GASTROINTESTINAL PANEL BY PCR, STOOL (REPLACES STOOL CULTURE)  CBC WITH DIFFERENTIAL/PLATELET  COMPREHENSIVE METABOLIC PANEL  LIPASE, BLOOD  PREGNANCY, URINE    EKG None  Radiology No results found.  Procedures Procedures    Medications Ordered in ED Medications - No data to display  ED Course/ Medical Decision Making/ A&P    47 year old here for evaluation of diarrhea after eating takeout.  Subsequently developed warm like object in stool.  No recent antibiotics or travel.  She did eat seafood at the restaurants.  She is afebrile, nonseptic, not ill-appearing.  Her lungs clear.  Abdomen soft, nontender.  I viewed the hydration and specimen cup at bedside.  Labs personally viewed and interpreted:  CBC without leukocytosis  metabolic panel without significant abnormality UA negative for infection Pregnancy test negative Unable to provide gastrointestinal panel sample here  I discussed results with patient.  I did discuss with lab they stated she provide stool sample can further delineate however patient unable to provide stool sample here in the ED.  Will treat for parasite.  [facility] who recommended albendazole 400 mg once, repeat a day #3  Discussed plan with patient.  She does have PCP appointment on Wednesday, 3 days from today.  I encouraged her to follow-up with her primary care provider then.  She did have incidental finding of small blood in her urine.  No infectious symptoms.  No flank pain to suggest stones.  States she has had this previously at her OB/GYN.  She will return for new or worsening findings  The patient has been appropriately medically  screened and/or stabilized in the ED. I have low suspicion for any other emergent medical condition which would require further screening, evaluation or treatment in the ED or require inpatient management.  Patient is hemodynamically stable and in no acute distress.  Patient able to ambulate in department prior to ED.  Evaluation does not show acute pathology that would require ongoing or additional emergent interventions while in the emergency department or further inpatient treatment.  I have discussed the diagnosis with the patient and answered all questions.  Pain is been managed while in the emergency department and patient has no further complaints prior to discharge.  Patient is comfortable with plan discussed in room and is stable for discharge at this time.  I have discussed strict return precautions for returning to the emergency department.  Patient  was encouraged to follow-up with PCP/specialist refer to at discharge.                             Medical Decision Making Amount and/or Complexity of Data Reviewed External Data Reviewed: labs and notes. Labs: ordered. Decision-making details documented in ED Course.  Risk OTC drugs. Prescription drug management. Diagnosis or treatment significantly limited by social determinants of health.         Final Clinical Impression(s) / ED Diagnoses Final diagnoses:  Diarrhea, unspecified type  Parasites in stool    Rx / DC Orders ED Discharge Orders          Ordered    albendazole (ALBENZA) 200 MG tablet   Once        12/22/22 1853              Tora Prunty A, PA-C 12/22/22 2032    Tegeler, Canary Brim, MD 12/22/22 2300

## 2022-12-23 ENCOUNTER — Telehealth (HOSPITAL_BASED_OUTPATIENT_CLINIC_OR_DEPARTMENT_OTHER): Payer: Self-pay | Admitting: Emergency Medicine

## 2022-12-25 DIAGNOSIS — E782 Mixed hyperlipidemia: Secondary | ICD-10-CM | POA: Diagnosis not present

## 2022-12-25 DIAGNOSIS — E559 Vitamin D deficiency, unspecified: Secondary | ICD-10-CM | POA: Diagnosis not present

## 2022-12-25 DIAGNOSIS — Z0000 Encounter for general adult medical examination without abnormal findings: Secondary | ICD-10-CM | POA: Diagnosis not present

## 2022-12-25 DIAGNOSIS — E221 Hyperprolactinemia: Secondary | ICD-10-CM | POA: Diagnosis not present

## 2022-12-25 DIAGNOSIS — E063 Autoimmune thyroiditis: Secondary | ICD-10-CM | POA: Diagnosis not present

## 2022-12-25 DIAGNOSIS — E039 Hypothyroidism, unspecified: Secondary | ICD-10-CM | POA: Diagnosis not present

## 2023-01-08 DIAGNOSIS — E039 Hypothyroidism, unspecified: Secondary | ICD-10-CM | POA: Diagnosis not present

## 2023-01-08 DIAGNOSIS — E221 Hyperprolactinemia: Secondary | ICD-10-CM | POA: Diagnosis not present

## 2023-01-15 DIAGNOSIS — E039 Hypothyroidism, unspecified: Secondary | ICD-10-CM | POA: Diagnosis not present

## 2023-01-15 DIAGNOSIS — E063 Autoimmune thyroiditis: Secondary | ICD-10-CM | POA: Diagnosis not present

## 2023-01-15 DIAGNOSIS — E221 Hyperprolactinemia: Secondary | ICD-10-CM | POA: Diagnosis not present

## 2023-01-23 ENCOUNTER — Ambulatory Visit: Payer: BC Managed Care – PPO | Admitting: Infectious Diseases

## 2023-01-31 DIAGNOSIS — R899 Unspecified abnormal finding in specimens from other organs, systems and tissues: Secondary | ICD-10-CM | POA: Diagnosis not present

## 2023-02-05 ENCOUNTER — Ambulatory Visit: Payer: BC Managed Care – PPO | Admitting: Infectious Disease

## 2023-02-06 ENCOUNTER — Ambulatory Visit: Payer: BC Managed Care – PPO | Admitting: Internal Medicine

## 2023-02-17 ENCOUNTER — Encounter: Payer: Self-pay | Admitting: Internal Medicine

## 2023-02-17 ENCOUNTER — Ambulatory Visit: Payer: BC Managed Care – PPO | Admitting: Internal Medicine

## 2023-02-17 ENCOUNTER — Other Ambulatory Visit: Payer: Self-pay

## 2023-02-17 VITALS — BP 107/72 | HR 72 | Resp 16 | Ht 62.0 in | Wt 150.0 lb

## 2023-02-17 DIAGNOSIS — R197 Diarrhea, unspecified: Secondary | ICD-10-CM

## 2023-02-17 NOTE — Progress Notes (Signed)
Patient: Betty Harrington  DOB: 1975-12-03 MRN: [id number] PCP: Jarrett Soho, PA-C    Chief Complaint  Patient presents with   Establish Care     Patient Active Problem List   Diagnosis Date Noted   Hashimoto's disease 03/08/2019   Hyperprolactinemia (HCC) 03/08/2019   Asthma (mild) 03/08/2019     Subjective:  Betty Harrington is a 47 y.o. F referred by Dr. Maryjane Hurter [facility]) for second opsion for intestinal parasites.  Patient was last seen by Dr. Ewing Schlein on 02/03/2023 for follow-up of procedure March.  For blood in stool.  No further information available on review of EMR. Reviewed notes from ED on 12/22/2022 where she presented with diarrhea and possible parasites in her stool.  Noted to have seen wormlike object in stool.  She was treated with albendazole 400 mg once for parasite on day 1 and 3.  Given PCP appointment.  She was unable to produce stool at ED visit. Review of Systems  All other systems reviewed and are negative.   Past Medical History:  Diagnosis Date   Abnormal uterine bleeding    Asthma    mild   Bulging lumbar disc    Cancer (HCC) summer 2016   skin basal on face   Hypothyroidism    Immune deficiency disorder (HCC)    hoshimoto's ( autoimmune)    Outpatient Medications Prior to Visit  Medication Sig Dispense Refill   albuterol (PROVENTIL HFA;VENTOLIN HFA) 108 (90 BASE) MCG/ACT inhaler Inhale 2 puffs into the lungs every 6 (six) hours as needed for wheezing.     albuterol (VENTOLIN HFA) 108 (90 Base) MCG/ACT inhaler INHALE 2 PUFFS EVERY 4 TO 6 HOURS AS NEEDED     bromocriptine (PARLODEL) 2.5 MG tablet Take 1 tablet (2.5 mg total) by mouth at bedtime. 30 tablet 3   EUTHYROX 50 MCG tablet TAKE 1 TABLET BY MOUTH ONCE DAILY IN THE MORNING ON AN EMPTY STOMACH     levothyroxine (SYNTHROID, LEVOTHROID) 50 MCG tablet Take 50 mcg by mouth daily.      No facility-administered medications prior to visit.     Allergies  Allergen Reactions    Azithromycin Shortness Of Breath   Guaifenesin Shortness Of Breath   Oxycodone-Acetaminophen Palpitations   Acetaminophen    Sulfamethoxazole-Trimethoprim Other (See Comments)   Tussionex Pennkinetic Er [Hydrocod Poli-Chlorphe Poli Er]     Makes her produce more saliva.    Codeine Other (See Comments)    Extra saliva   Dilaudid [Hydromorphone Hcl] Palpitations   Latex Rash   Oxycodone Palpitations   Sulfa Antibiotics Rash and Other (See Comments)    Social History   Tobacco Use   Smoking status: Former    Current packs/day: 0.50    Average packs/day: 0.5 packs/day for 20.0 years (10.0 ttl pk-yrs)    Types: Cigarettes   Smokeless tobacco: Never   Tobacco comments:    Quit in 2010  Vaping Use   Vaping status: Never Used  Substance Use Topics   Alcohol use: No   Drug use: No    Family History  Problem Relation Age of Onset   Hyperlipidemia Mother    Hypertension Mother    Breast cancer Mother        in her 59's   Heart failure Father    Cancer Father    Hypertension Father    Diabetes Father    COPD Father    Diabetes Sister     Objective:  There were  no vitals filed for this visit. There is no height or weight on file to calculate BMI.  Physical Exam Constitutional:      Appearance: Normal appearance.  HENT:     Head: Normocephalic and atraumatic.     Right Ear: Tympanic membrane normal.     Left Ear: Tympanic membrane normal.     Nose: Nose normal.     Mouth/Throat:     Mouth: Mucous membranes are moist.  Eyes:     Extraocular Movements: Extraocular movements intact.     Conjunctiva/sclera: Conjunctivae normal.     Pupils: Pupils are equal, round, and reactive to light.  Cardiovascular:     Rate and Rhythm: Normal rate and regular rhythm.     Heart sounds: No murmur heard.    No friction rub. No gallop.  Pulmonary:     Effort: Pulmonary effort is normal.     Breath sounds: Normal breath sounds.  Abdominal:     General: Abdomen is flat.      Palpations: Abdomen is soft.  Musculoskeletal:        General: Normal range of motion.  Skin:    General: Skin is warm and dry.  Neurological:     General: No focal deficit present.     Mental Status: She is alert and oriented to person, place, and time.  Psychiatric:        Mood and Affect: Mood normal.     Lab Results: Lab Results  Component Value Date   WBC 5.9 12/22/2022   HGB 12.5 12/22/2022   HCT 38.5 12/22/2022   MCV 87.5 12/22/2022   PLT 235 12/22/2022    Lab Results  Component Value Date   CREATININE 0.74 12/22/2022   BUN 13 12/22/2022   NA 138 12/22/2022   K 3.6 12/22/2022   CL 106 12/22/2022   CO2 23 12/22/2022    Lab Results  Component Value Date   ALT 17 12/22/2022   AST 16 12/22/2022   ALKPHOS 54 12/22/2022   BILITOT 0.5 12/22/2022     Assessment & Plan:  #Concern for parasite in stoo l-Pt states in early 2000s she was treated for possible parasite infection. Son was was exposed. Notes she has not had a postive stool study. In 2023, seen by PCP Rx albendazole for possible pinworm based on exam. -Seen in the ED 7/14 when she had diarrhea and saw worm like object in her stool after eating fish(frozen salmon that she believes she undercooked). Empirically treated with albbendazolee on day 1 and 3 as she was unable to produce stool  -She was seen by Dr. Ewing Schlein for what I can tell is a f/u procedure in march for blood in stool as noted in "reason for visit". Referred to ID for 2nd opinion and c/f for intestinal parasite. Will get records form GI, unable to see further information - She brought in pictures of concern in stool that she beieves are worm. Also, a few weeks ago had vaginal itching which improved with monostat. Around that time she believes she saw something move in the toilet bowl.  -Denies international travel Plan: -Stool studies today -Obtain records from [place] physicians, GI(release of records) with labs. She notes that she has intermitent  diarrhea with"different looking stool", notes some abdominal pain. I counseled pt to F/U with GI - Follow-up in 2 weeks to review labs and note  Danelle Earthly, MD [facility]   02/17/23  3:10 PM  I have personally spent 65 minutes involved in face-to-face and non-face-to-face activities for this patient on the day of the visit. Professional time spent includes the following activities: Preparing to see the patient (review of tests), Obtaining and/or reviewing separately obtained history (admission/discharge record), Performing a medically appropriate examination and/or evaluation , Ordering medications/tests/procedures, referring and communicating with other health care professionals, Documenting clinical information in the EMR, Independently interpreting results (not separately reported), Communicating results to the patient/family/caregiver, Counseling and educating the patient/family/caregiver and Care coordination (not separately reported).

## 2023-02-24 ENCOUNTER — Other Ambulatory Visit: Payer: Self-pay

## 2023-02-24 ENCOUNTER — Telehealth: Payer: Self-pay

## 2023-02-24 ENCOUNTER — Other Ambulatory Visit: Payer: BC Managed Care – PPO

## 2023-02-24 DIAGNOSIS — R197 Diarrhea, unspecified: Secondary | ICD-10-CM | POA: Diagnosis not present

## 2023-02-24 NOTE — Telephone Encounter (Signed)
-----   Message from Harley Alto sent at 02/24/2023  2:34 PM EDT ----- Regarding: Recollect stools Patient needs to recollect the stool samples for Gastro Panel, Stool cult and O&P. She placed all samples on the ice bath. Only C.difff can be cold, it has been processed for send out to lab. Orders canceled and placed as future.

## 2023-02-24 NOTE — Telephone Encounter (Signed)
Left voicemail requesting call back. Will need to recollect stool samples that were dropped off.  Juanita Laster, RMA

## 2023-02-24 NOTE — Telephone Encounter (Signed)
Patient returning to pick up new stool kits. Instructions will be given to the patient on how to do the stool kits

## 2023-02-24 NOTE — Addendum Note (Signed)
Addended by: Harley Alto on: 02/24/2023 02:23 PM   Modules accepted: Orders

## 2023-02-25 LAB — C. DIFFICILE GDH AND TOXIN A/B
GDH ANTIGEN: NOT DETECTED
MICRO NUMBER:: 15472964
SPECIMEN QUALITY:: ADEQUATE
TOXIN A AND B: NOT DETECTED

## 2023-02-27 ENCOUNTER — Other Ambulatory Visit: Payer: Self-pay

## 2023-02-27 ENCOUNTER — Other Ambulatory Visit: Payer: BC Managed Care – PPO

## 2023-02-27 DIAGNOSIS — R197 Diarrhea, unspecified: Secondary | ICD-10-CM | POA: Diagnosis not present

## 2023-03-01 LAB — SALMONELLA/SHIGELLA CULT, CAMPY EIA AND SHIGA TOXIN RFL ECOLI
MICRO NUMBER: 15490115
SPECIMEN QUALITY: ADEQUATE

## 2023-03-03 LAB — SALMONELLA/SHIGELLA CULT, CAMPY EIA AND SHIGA TOXIN RFL ECOLI
MICRO NUMBER:: 15490117
SPECIMEN QUALITY:: ADEQUATE
SS RESULT:: NOT DETECTED

## 2023-03-07 ENCOUNTER — Telehealth: Payer: Self-pay

## 2023-03-07 DIAGNOSIS — R197 Diarrhea, unspecified: Secondary | ICD-10-CM

## 2023-03-07 LAB — SALMONELLA/SHIGELLA CULT, CAMPY EIA AND SHIGA TOXIN RFL ECOLI
MICRO NUMBER: 15490115
Result:: NOT DETECTED
SPECIMEN QUALITY: ADEQUATE

## 2023-03-07 LAB — OVA AND PARASITE EXAMINATION

## 2023-03-07 LAB — GASTROINTESTINAL PATHOGEN PNL

## 2023-03-07 NOTE — Telephone Encounter (Signed)
-----   Message from Harley Alto sent at 03/07/2023 11:38 AM EDT ----- Regarding: Stool Recollection I received a call from the patient at 1109 today, inquiring about the Gastro Stool Panel being canceled. I called [facility] and verified with Ladona Ridgel at 1125 that this was a [facility] error and that after receiving the sample, it was sent to the wrong lab, and by the time it was forwarded to correct lab, the specimen stability was not stable for testing, so [facility] canceled the order. Ova & Parasites is still in progress, and Stool Culture resulted.

## 2023-03-07 NOTE — Telephone Encounter (Signed)
Per Dr. Thedore Mins okay to repeat testing if patient is having symptoms.  Spoke with patient who verbalized frustration regarding canceled lab. Is requesting lab order be placed to [facility]. Would like call from [facility] Manager to discuss this.  Juanita Laster, RMA

## 2023-03-17 NOTE — Addendum Note (Signed)
Addended by: Juanita Laster on: 03/17/2023 10:49 AM   Modules accepted: Orders

## 2023-03-17 NOTE — Telephone Encounter (Signed)
Spoke with patient regarding test. She would like to have them done at [facility] on [address]. Left voicemail with staff there to confirm they are able to provide specimen cups.  Will call patient once everything is done.  Juanita Laster, RMA

## 2023-03-18 LAB — OVA AND PARASITE EXAMINATION

## 2023-03-19 NOTE — Telephone Encounter (Signed)
Spoke with lab tech at [facility] who confirm they received orders faxed on Monday. Did confirm they are able to provide patient with stool kit with correct containers.  Left voicemail updating patient she can pick this up today. Juanita Laster, RMA

## 2023-03-21 DIAGNOSIS — L814 Other melanin hyperpigmentation: Secondary | ICD-10-CM | POA: Diagnosis not present

## 2023-03-21 DIAGNOSIS — D2362 Other benign neoplasm of skin of left upper limb, including shoulder: Secondary | ICD-10-CM | POA: Diagnosis not present

## 2023-03-21 DIAGNOSIS — L918 Other hypertrophic disorders of the skin: Secondary | ICD-10-CM | POA: Diagnosis not present

## 2023-03-21 DIAGNOSIS — L821 Other seborrheic keratosis: Secondary | ICD-10-CM | POA: Diagnosis not present

## 2023-03-24 DIAGNOSIS — M7501 Adhesive capsulitis of right shoulder: Secondary | ICD-10-CM | POA: Diagnosis not present

## 2023-04-09 DIAGNOSIS — K529 Noninfective gastroenteritis and colitis, unspecified: Secondary | ICD-10-CM | POA: Diagnosis not present

## 2023-04-10 DIAGNOSIS — M25511 Pain in right shoulder: Secondary | ICD-10-CM | POA: Diagnosis not present

## 2023-04-10 DIAGNOSIS — M25611 Stiffness of right shoulder, not elsewhere classified: Secondary | ICD-10-CM | POA: Diagnosis not present

## 2023-04-10 DIAGNOSIS — M19011 Primary osteoarthritis, right shoulder: Secondary | ICD-10-CM | POA: Diagnosis not present

## 2023-04-10 DIAGNOSIS — M7501 Adhesive capsulitis of right shoulder: Secondary | ICD-10-CM | POA: Diagnosis not present

## 2023-04-16 DIAGNOSIS — M7501 Adhesive capsulitis of right shoulder: Secondary | ICD-10-CM | POA: Diagnosis not present

## 2023-04-16 DIAGNOSIS — M25611 Stiffness of right shoulder, not elsewhere classified: Secondary | ICD-10-CM | POA: Diagnosis not present

## 2023-04-16 DIAGNOSIS — M25511 Pain in right shoulder: Secondary | ICD-10-CM | POA: Diagnosis not present

## 2023-04-18 DIAGNOSIS — M25511 Pain in right shoulder: Secondary | ICD-10-CM | POA: Diagnosis not present

## 2023-04-18 DIAGNOSIS — N649 Disorder of breast, unspecified: Secondary | ICD-10-CM | POA: Diagnosis not present

## 2023-04-18 DIAGNOSIS — M25611 Stiffness of right shoulder, not elsewhere classified: Secondary | ICD-10-CM | POA: Diagnosis not present

## 2023-04-18 DIAGNOSIS — M7501 Adhesive capsulitis of right shoulder: Secondary | ICD-10-CM | POA: Diagnosis not present

## 2023-04-23 DIAGNOSIS — M25511 Pain in right shoulder: Secondary | ICD-10-CM | POA: Diagnosis not present

## 2023-04-23 DIAGNOSIS — M25611 Stiffness of right shoulder, not elsewhere classified: Secondary | ICD-10-CM | POA: Diagnosis not present

## 2023-04-23 DIAGNOSIS — M7501 Adhesive capsulitis of right shoulder: Secondary | ICD-10-CM | POA: Diagnosis not present

## 2023-04-25 DIAGNOSIS — M25611 Stiffness of right shoulder, not elsewhere classified: Secondary | ICD-10-CM | POA: Diagnosis not present

## 2023-04-25 DIAGNOSIS — R197 Diarrhea, unspecified: Secondary | ICD-10-CM | POA: Diagnosis not present

## 2023-04-25 DIAGNOSIS — M25511 Pain in right shoulder: Secondary | ICD-10-CM | POA: Diagnosis not present

## 2023-04-25 DIAGNOSIS — M7501 Adhesive capsulitis of right shoulder: Secondary | ICD-10-CM | POA: Diagnosis not present

## 2023-05-01 DIAGNOSIS — M25611 Stiffness of right shoulder, not elsewhere classified: Secondary | ICD-10-CM | POA: Diagnosis not present

## 2023-05-01 DIAGNOSIS — M25511 Pain in right shoulder: Secondary | ICD-10-CM | POA: Diagnosis not present

## 2023-05-01 DIAGNOSIS — M7501 Adhesive capsulitis of right shoulder: Secondary | ICD-10-CM | POA: Diagnosis not present

## 2023-05-15 ENCOUNTER — Telehealth: Payer: Self-pay | Admitting: Internal Medicine

## 2023-05-15 ENCOUNTER — Telehealth: Payer: Self-pay

## 2023-05-15 NOTE — Telephone Encounter (Signed)
-  Dicussed with patient that stool ova stool and parasite negative. Pt is concerned with "look" of stool. Pt plans to f/u with GI.  She would like to for go GIP at this time.

## 2023-05-15 NOTE — Telephone Encounter (Signed)
Patient called wanting to know stool test results done at [facility].   GI profile will need to be done again due to specimen being overfilled.   Ova parasite results are in.   Secure chat sent to Dr.Singh - she will reach out to patient directly to discuss.   Maridel Pixler Lesli Albee, CMA

## 2023-05-21 DIAGNOSIS — M25511 Pain in right shoulder: Secondary | ICD-10-CM | POA: Diagnosis not present

## 2023-05-21 DIAGNOSIS — M7501 Adhesive capsulitis of right shoulder: Secondary | ICD-10-CM | POA: Diagnosis not present

## 2023-05-21 DIAGNOSIS — M25611 Stiffness of right shoulder, not elsewhere classified: Secondary | ICD-10-CM | POA: Diagnosis not present

## 2023-05-22 DIAGNOSIS — M25511 Pain in right shoulder: Secondary | ICD-10-CM | POA: Diagnosis not present

## 2023-07-07 DIAGNOSIS — R194 Change in bowel habit: Secondary | ICD-10-CM | POA: Diagnosis not present

## 2023-07-14 DIAGNOSIS — R194 Change in bowel habit: Secondary | ICD-10-CM | POA: Diagnosis not present

## 2023-08-15 DIAGNOSIS — R194 Change in bowel habit: Secondary | ICD-10-CM | POA: Diagnosis not present

## 2023-08-25 ENCOUNTER — Other Ambulatory Visit: Payer: Self-pay | Admitting: Family Medicine

## 2023-08-25 DIAGNOSIS — Z1231 Encounter for screening mammogram for malignant neoplasm of breast: Secondary | ICD-10-CM

## 2023-09-23 DIAGNOSIS — R5383 Other fatigue: Secondary | ICD-10-CM | POA: Diagnosis not present

## 2023-09-23 DIAGNOSIS — B349 Viral infection, unspecified: Secondary | ICD-10-CM | POA: Diagnosis not present

## 2023-09-23 DIAGNOSIS — R1110 Vomiting, unspecified: Secondary | ICD-10-CM | POA: Diagnosis not present

## 2023-09-23 DIAGNOSIS — R197 Diarrhea, unspecified: Secondary | ICD-10-CM | POA: Diagnosis not present

## 2023-09-29 DIAGNOSIS — E063 Autoimmune thyroiditis: Secondary | ICD-10-CM | POA: Diagnosis not present

## 2023-09-29 DIAGNOSIS — A084 Viral intestinal infection, unspecified: Secondary | ICD-10-CM | POA: Diagnosis not present

## 2023-09-29 DIAGNOSIS — E039 Hypothyroidism, unspecified: Secondary | ICD-10-CM | POA: Diagnosis not present

## 2023-09-29 DIAGNOSIS — E221 Hyperprolactinemia: Secondary | ICD-10-CM | POA: Diagnosis not present

## 2023-09-29 DIAGNOSIS — Z6828 Body mass index (BMI) 28.0-28.9, adult: Secondary | ICD-10-CM | POA: Diagnosis not present

## 2023-09-30 ENCOUNTER — Other Ambulatory Visit: Payer: Self-pay | Admitting: Gastroenterology

## 2023-09-30 DIAGNOSIS — K8689 Other specified diseases of pancreas: Secondary | ICD-10-CM

## 2023-10-07 ENCOUNTER — Ambulatory Visit
Admission: RE | Admit: 2023-10-07 | Discharge: 2023-10-07 | Disposition: A | Discharge status: Home / Self Care | Source: Ambulatory Visit | Attending: Gastroenterology | Admitting: Gastroenterology

## 2023-10-07 DIAGNOSIS — K8689 Other specified diseases of pancreas: Secondary | ICD-10-CM

## 2023-10-07 MED ORDER — IOPAMIDOL (ISOVUE-300) INJECTION 61%
80.0000 mL | Freq: Once | INTRAVENOUS | Status: AC | PRN
  Administered 2023-10-07: 80 mL via INTRAVENOUS

## 2023-10-10 DIAGNOSIS — R159 Full incontinence of feces: Secondary | ICD-10-CM | POA: Diagnosis not present

## 2023-10-10 DIAGNOSIS — R197 Diarrhea, unspecified: Secondary | ICD-10-CM | POA: Diagnosis not present

## 2023-10-10 DIAGNOSIS — R109 Unspecified abdominal pain: Secondary | ICD-10-CM | POA: Diagnosis not present

## 2023-10-10 DIAGNOSIS — R195 Other fecal abnormalities: Secondary | ICD-10-CM | POA: Diagnosis not present

## 2023-10-15 DIAGNOSIS — F439 Reaction to severe stress, unspecified: Secondary | ICD-10-CM | POA: Diagnosis not present

## 2023-10-15 DIAGNOSIS — R194 Change in bowel habit: Secondary | ICD-10-CM | POA: Diagnosis not present

## 2023-10-15 DIAGNOSIS — E063 Autoimmune thyroiditis: Secondary | ICD-10-CM | POA: Diagnosis not present

## 2023-11-10 DIAGNOSIS — E162 Hypoglycemia, unspecified: Secondary | ICD-10-CM | POA: Diagnosis not present

## 2023-11-10 DIAGNOSIS — E221 Hyperprolactinemia: Secondary | ICD-10-CM | POA: Diagnosis not present

## 2023-11-10 DIAGNOSIS — E039 Hypothyroidism, unspecified: Secondary | ICD-10-CM | POA: Diagnosis not present

## 2023-11-17 DIAGNOSIS — E063 Autoimmune thyroiditis: Secondary | ICD-10-CM | POA: Diagnosis not present

## 2023-11-17 DIAGNOSIS — E221 Hyperprolactinemia: Secondary | ICD-10-CM | POA: Diagnosis not present

## 2023-11-17 DIAGNOSIS — E162 Hypoglycemia, unspecified: Secondary | ICD-10-CM | POA: Diagnosis not present

## 2023-11-17 DIAGNOSIS — E039 Hypothyroidism, unspecified: Secondary | ICD-10-CM | POA: Diagnosis not present

## 2023-12-01 DIAGNOSIS — N951 Menopausal and female climacteric states: Secondary | ICD-10-CM | POA: Diagnosis not present

## 2023-12-01 DIAGNOSIS — Z6827 Body mass index (BMI) 27.0-27.9, adult: Secondary | ICD-10-CM | POA: Diagnosis not present

## 2023-12-01 DIAGNOSIS — Z01419 Encounter for gynecological examination (general) (routine) without abnormal findings: Secondary | ICD-10-CM | POA: Diagnosis not present

## 2023-12-01 DIAGNOSIS — Z1151 Encounter for screening for human papillomavirus (HPV): Secondary | ICD-10-CM | POA: Diagnosis not present

## 2023-12-01 DIAGNOSIS — Z124 Encounter for screening for malignant neoplasm of cervix: Secondary | ICD-10-CM | POA: Diagnosis not present

## 2023-12-22 ENCOUNTER — Ambulatory Visit

## 2023-12-26 ENCOUNTER — Ambulatory Visit: Admitting: Podiatry

## 2023-12-26 VITALS — Ht 62.0 in | Wt 150.0 lb

## 2023-12-26 DIAGNOSIS — S90222A Contusion of left lesser toe(s) with damage to nail, initial encounter: Secondary | ICD-10-CM

## 2023-12-26 DIAGNOSIS — L603 Nail dystrophy: Secondary | ICD-10-CM

## 2023-12-26 DIAGNOSIS — Z6827 Body mass index (BMI) 27.0-27.9, adult: Secondary | ICD-10-CM | POA: Diagnosis not present

## 2023-12-26 DIAGNOSIS — Z0000 Encounter for general adult medical examination without abnormal findings: Secondary | ICD-10-CM | POA: Diagnosis not present

## 2023-12-26 DIAGNOSIS — S90221A Contusion of right lesser toe(s) with damage to nail, initial encounter: Secondary | ICD-10-CM | POA: Diagnosis not present

## 2023-12-26 NOTE — Progress Notes (Addendum)
 Subjective:  Patient ID: Betty Harrington, female    DOB: 1975-10-23,  MRN: [id number]  Chief Complaint  Patient presents with   Nail Problem    Rm 11 Patient is here for bilateral hallux discoloration. Patient states no pain or discomfort. Patient states having previous ingrown procedure on the left hallux which left a  ridge-like growth on the    bottom of the nail bed.    Discussed the use of AI scribe software for clinical note transcription with the patient, who gave verbal consent to proceed.  History of Present Illness Betty Harrington is a 48 year old female who presents with discoloration and ridges on her toenails.  She underwent a procedure several years ago to prevent ingrown toenails, resulting in a ridge on her toenail. There have been no further ingrown toenails since the procedure.  A discoloration with dark spots has been present for 3 months, located in the corner of the toenail. The discoloration became more noticeable after removing red toenail polish. She has been monitoring the changes with photographs. Her cousin, a podiatrist, suggested a possible fungal infection due to prolonged nail polish use.  She is concerned about melanoma due to the discoloration.There is no pain or discomfort, and she recently trimmed the toenail after removing the polish.  She also reports this could be caused due to wearing shoes that were too tight as well.      Objective:    Physical Exam General: AAO x3, NAD  Dermatological: There is transverse ridging present of the bilateral hallux toenails as pictured below.  Also as pictured below there are 2 areas of hyperpigmentation of the left hallux toenail and one smaller area on the right hallux toenail or on the base on the right side.  I did review pictures that she showed me as well and appears that the dark spots are growing out compared to what I see today.  There is no edema, erythema or signs of infection.  No open  lesions.  Vascular: Dorsalis Pedis artery and Posterior Tibial artery pedal pulses are 2/4 bilateral with immedate capillary fill time. There is no pain with calf compression, swelling, warmth, erythema.   Neruologic: Grossly intact via light touch bilateral.   Musculoskeletal: No tenderness on exam.  Gait: Unassisted, Nonantalgic.          Results     Assessment:   1. Nail dystrophy   2. Subungual hematoma of toenail, left, initial encounter   3. Subungual hematoma of toenail, right, initial encounter      Plan:  Patient was evaluated and treated and all questions answered.  Assessment and Plan Assessment & Plan Toenail discoloration Discoloration likely due to subungual hematoma or trauma. Low risk of melanoma. Biopsy definitive for ruling out melanoma. She is concerned about melanoma but hesitant about biopsy.  Discussed the only way to definitely diagnose this is through biopsy. - After long discussion today we decided to continue to monitor this as it does appear that the dark colors is growing out.  Should there any changes or worsening then would strongly encourage biopsy and/or removal of the nail.  She also has a dermatologist and recommended follow-up with dermatology as well for evaluation and she is not to contact them for an appointment.  I will see her back in 1 month but should symptoms change or she want to go ahead and proceed with the biopsy to let us  know I will be happy to schedule  her for this.   - I also did review the images with another provider in the office today who agreed with the plan.     Donnice JONELLE Fees DPM

## 2023-12-26 NOTE — Patient Instructions (Signed)
 You can use a biotin supplement and also urea nail gel to help the nail  Follow-up with your dermatologist

## 2024-01-01 DIAGNOSIS — E559 Vitamin D deficiency, unspecified: Secondary | ICD-10-CM | POA: Diagnosis not present

## 2024-01-01 DIAGNOSIS — E782 Mixed hyperlipidemia: Secondary | ICD-10-CM | POA: Diagnosis not present

## 2024-01-06 ENCOUNTER — Ambulatory Visit
Admission: RE | Admit: 2024-01-06 | Discharge: 2024-01-06 | Disposition: A | Discharge status: Home / Self Care | Source: Ambulatory Visit | Attending: Family Medicine | Admitting: Family Medicine

## 2024-01-06 DIAGNOSIS — Z1231 Encounter for screening mammogram for malignant neoplasm of breast: Secondary | ICD-10-CM

## 2024-01-14 DIAGNOSIS — E039 Hypothyroidism, unspecified: Secondary | ICD-10-CM | POA: Diagnosis not present

## 2024-01-14 DIAGNOSIS — E221 Hyperprolactinemia: Secondary | ICD-10-CM | POA: Diagnosis not present

## 2024-01-21 DIAGNOSIS — E162 Hypoglycemia, unspecified: Secondary | ICD-10-CM | POA: Diagnosis not present

## 2024-01-21 DIAGNOSIS — E039 Hypothyroidism, unspecified: Secondary | ICD-10-CM | POA: Diagnosis not present

## 2024-01-21 DIAGNOSIS — E221 Hyperprolactinemia: Secondary | ICD-10-CM | POA: Diagnosis not present

## 2024-01-21 DIAGNOSIS — E063 Autoimmune thyroiditis: Secondary | ICD-10-CM | POA: Diagnosis not present

## 2024-01-26 ENCOUNTER — Ambulatory Visit (INDEPENDENT_AMBULATORY_CARE_PROVIDER_SITE_OTHER): Admitting: Podiatry

## 2024-01-26 DIAGNOSIS — L603 Nail dystrophy: Secondary | ICD-10-CM

## 2024-01-26 DIAGNOSIS — S90222D Contusion of left lesser toe(s) with damage to nail, subsequent encounter: Secondary | ICD-10-CM | POA: Diagnosis not present

## 2024-01-26 DIAGNOSIS — S90221D Contusion of right lesser toe(s) with damage to nail, subsequent encounter: Secondary | ICD-10-CM

## 2024-01-26 NOTE — Progress Notes (Unsigned)
     Subjective:  Patient ID: Betty Harrington, female    DOB: 03-23-1976,  MRN: [id number]  Chief Complaint  Patient presents with   Nail Problem    Nail follow up pt stated that the nail is looking better     History of Present Illness Betty Harrington is a 48 year old female who presents with discoloration and ridges on her toenails.  She states overall she is doing better she was going to cancel today's appointment as they were improving.  She has no pain of the nails and no swelling or redness or any drainage.  She has not yet followed up with dermatology.  No other concerns.    Objective:    Physical Exam General: AAO x3, NAD  Dermatological: There is transverse ridging present of the bilateral hallux toenails.  There are 2 small areas of hyper mentation left hallux toenail but are much more faint in color and growing out.  Overall there is improvement.  There is no edema, erythema or signs of infection.  There is no hyperpigmentation of the surrounding skin.  Vascular: Dorsalis Pedis artery and Posterior Tibial artery pedal pulses are 2/4 bilateral with immedate capillary fill time. There is no pain with calf compression, swelling, warmth, erythema.   Neruologic: Grossly intact via light touch bilateral.   Musculoskeletal: No tenderness on exam.  Gait: Unassisted, Nonantalgic.    Results     Assessment:   Nail dystrophy, subungual hematoma  Plan:  Patient was evaluated and treated and all questions answered.  Assessment and Plan Assessment & Plan Toenail discoloration Discoloration likely due to subungual hematoma or trauma. Low risk of melanoma, especially since that color is growing out and more faint today.  I did we will continue to monitor and to hold off biopsy continue to monitor closely.  Should there be any changes or worsening will reevaluate.  Recommend dermatology evaluation as she does develop a rash as well, which I do not appreciate today.    Return if symptoms worsen or fail to improve.  Donnice JONELLE Fees DPM

## 2024-02-11 DIAGNOSIS — R5383 Other fatigue: Secondary | ICD-10-CM | POA: Diagnosis not present

## 2024-02-18 DIAGNOSIS — T887XXA Unspecified adverse effect of drug or medicament, initial encounter: Secondary | ICD-10-CM | POA: Diagnosis not present

## 2024-03-23 ENCOUNTER — Telehealth: Payer: Self-pay | Admitting: Hematology and Oncology

## 2024-03-24 ENCOUNTER — Other Ambulatory Visit

## 2024-03-24 ENCOUNTER — Encounter: Admitting: Hematology and Oncology

## 2024-03-24 DIAGNOSIS — R7989 Other specified abnormal findings of blood chemistry: Secondary | ICD-10-CM | POA: Diagnosis not present

## 2024-03-26 DIAGNOSIS — D2362 Other benign neoplasm of skin of left upper limb, including shoulder: Secondary | ICD-10-CM | POA: Diagnosis not present

## 2024-03-26 DIAGNOSIS — D1801 Hemangioma of skin and subcutaneous tissue: Secondary | ICD-10-CM | POA: Diagnosis not present

## 2024-03-26 DIAGNOSIS — L814 Other melanin hyperpigmentation: Secondary | ICD-10-CM | POA: Diagnosis not present

## 2024-03-26 DIAGNOSIS — L821 Other seborrheic keratosis: Secondary | ICD-10-CM | POA: Diagnosis not present
